# Patient Record
Sex: Female | Born: 1990 | Hispanic: Yes | Marital: Single | Smoking: Never smoker
Health system: Southern US, Community
[De-identification: ages and names within clinical notes are randomized; demographics above are authoritative.]

## PROBLEM LIST (undated history)

## (undated) DIAGNOSIS — Z789 Other specified health status: Secondary | ICD-10-CM

## (undated) HISTORY — PX: NO PAST SURGERIES: SHX2092

## (undated) HISTORY — DX: Other specified health status: Z78.9

---

## 2010-08-01 ENCOUNTER — Ambulatory Visit: Payer: Self-pay | Admitting: Obstetrics and Gynecology

## 2010-08-01 LAB — CONVERTED CEMR LAB
Antibody Screen: NEGATIVE
Basophils Absolute: 0 10*3/uL (ref 0.0–0.1)
Basophils Relative: 0 % (ref 0–1)
Chlamydia, DNA Probe: NEGATIVE
Eosinophils Absolute: 0 10*3/uL (ref 0.0–0.7)
Eosinophils Relative: 0 % (ref 0–5)
GC Probe Amp, Genital: NEGATIVE
HCT: 37.3 % (ref 36.0–46.0)
Hemoglobin: 12.7 g/dL (ref 12.0–15.0)
Hepatitis B Surface Ag: NEGATIVE
Hgb A2 Quant: 1.7 % — ABNORMAL LOW (ref 2.2–3.2)
Hgb A: 98.3 % — ABNORMAL HIGH (ref 96.8–97.8)
Hgb F Quant: 0 % (ref 0.0–2.0)
Hgb S Quant: 0 % (ref 0.0–0.0)
Lymphocytes Relative: 19 % (ref 12–46)
Lymphs Abs: 1.6 10*3/uL (ref 0.7–4.0)
MCHC: 34 g/dL (ref 30.0–36.0)
MCV: 92.6 fL (ref 78.0–100.0)
Monocytes Absolute: 0.3 10*3/uL (ref 0.1–1.0)
Monocytes Relative: 4 % (ref 3–12)
Neutro Abs: 6.5 10*3/uL (ref 1.7–7.7)
Neutrophils Relative %: 77 % (ref 43–77)
Platelets: 202 10*3/uL (ref 150–400)
RBC: 4.03 M/uL (ref 3.87–5.11)
RDW: 13.6 % (ref 11.5–15.5)
Rh Type: POSITIVE
Rubella: 160.7 intl units/mL — ABNORMAL HIGH
WBC: 8.5 10*3/uL (ref 4.0–10.5)

## 2010-08-02 ENCOUNTER — Encounter: Payer: Self-pay | Admitting: Obstetrics and Gynecology

## 2010-08-02 LAB — CONVERTED CEMR LAB
Clue Cells Wet Prep HPF POC: NONE SEEN
Trich, Wet Prep: NONE SEEN

## 2010-08-03 ENCOUNTER — Ambulatory Visit (HOSPITAL_COMMUNITY): Admission: RE | Admit: 2010-08-03 | Discharge: 2010-08-03 | Payer: Self-pay | Admitting: Family Medicine

## 2010-08-29 ENCOUNTER — Ambulatory Visit: Payer: Self-pay | Admitting: Obstetrics & Gynecology

## 2010-09-26 ENCOUNTER — Encounter: Payer: Self-pay | Admitting: Physician Assistant

## 2010-09-26 ENCOUNTER — Ambulatory Visit: Payer: Self-pay | Admitting: Obstetrics and Gynecology

## 2010-10-17 ENCOUNTER — Ambulatory Visit (HOSPITAL_COMMUNITY): Admission: RE | Admit: 2010-10-17 | Discharge: 2010-10-17 | Payer: Self-pay | Admitting: Family Medicine

## 2010-10-17 ENCOUNTER — Encounter: Payer: Self-pay | Admitting: Obstetrics and Gynecology

## 2010-10-17 ENCOUNTER — Ambulatory Visit: Payer: Self-pay | Admitting: Obstetrics and Gynecology

## 2010-10-31 ENCOUNTER — Ambulatory Visit: Payer: Self-pay | Admitting: Obstetrics and Gynecology

## 2010-10-31 ENCOUNTER — Encounter: Payer: Self-pay | Admitting: Obstetrics and Gynecology

## 2010-10-31 LAB — CONVERTED CEMR LAB
HCT: 37.4 % (ref 36.0–46.0)
HIV: NONREACTIVE
Hemoglobin: 12.2 g/dL (ref 12.0–15.0)
MCHC: 32.6 g/dL (ref 30.0–36.0)
MCV: 95.4 fL (ref 78.0–100.0)
Platelets: 241 10*3/uL (ref 150–400)
RBC: 3.92 M/uL (ref 3.87–5.11)
RDW: 13.2 % (ref 11.5–15.5)
WBC: 10.2 10*3/uL (ref 4.0–10.5)

## 2010-11-01 ENCOUNTER — Encounter: Payer: Self-pay | Admitting: Obstetrics and Gynecology

## 2010-11-14 ENCOUNTER — Ambulatory Visit: Payer: Self-pay | Admitting: Physician Assistant

## 2010-11-14 ENCOUNTER — Encounter: Payer: Self-pay | Admitting: Infectious Disease

## 2010-11-28 ENCOUNTER — Ambulatory Visit: Admit: 2010-11-28 | Payer: Self-pay | Admitting: Obstetrics and Gynecology

## 2010-12-05 ENCOUNTER — Ambulatory Visit: Admit: 2010-12-05 | Payer: Self-pay | Admitting: Obstetrics and Gynecology

## 2010-12-12 ENCOUNTER — Encounter: Payer: Self-pay | Admitting: Obstetrics and Gynecology

## 2010-12-12 ENCOUNTER — Ambulatory Visit
Admission: RE | Admit: 2010-12-12 | Discharge: 2010-12-12 | Payer: Self-pay | Source: Home / Self Care | Attending: Obstetrics & Gynecology | Admitting: Obstetrics & Gynecology

## 2010-12-12 LAB — CONVERTED CEMR LAB
Chlamydia, DNA Probe: NEGATIVE
GC Probe Amp, Genital: NEGATIVE

## 2010-12-17 LAB — POCT URINALYSIS DIPSTICK
Bilirubin Urine: NEGATIVE
Nitrite: NEGATIVE
Protein, ur: 30 mg/dL — AB
Specific Gravity, Urine: 1.025 (ref 1.005–1.030)
Urine Glucose, Fasting: NEGATIVE mg/dL
Urobilinogen, UA: 0.2 mg/dL (ref 0.0–1.0)
pH: 6 (ref 5.0–8.0)

## 2010-12-19 ENCOUNTER — Ambulatory Visit
Admission: RE | Admit: 2010-12-19 | Discharge: 2010-12-19 | Payer: Self-pay | Source: Home / Self Care | Attending: Obstetrics and Gynecology | Admitting: Obstetrics and Gynecology

## 2010-12-19 LAB — POCT URINALYSIS DIPSTICK
Bilirubin Urine: NEGATIVE
Ketones, ur: NEGATIVE mg/dL
Nitrite: NEGATIVE
Protein, ur: 100 mg/dL — AB
Specific Gravity, Urine: >=1.03 (ref 1.005–1.030)
Urine Glucose, Fasting: NEGATIVE mg/dL
Urobilinogen, UA: 0.2 mg/dL (ref 0.0–1.0)
pH: 6 (ref 5.0–8.0)

## 2010-12-20 ENCOUNTER — Inpatient Hospital Stay (HOSPITAL_COMMUNITY)
Admission: AD | Admit: 2010-12-20 | Discharge: 2010-12-22 | Payer: Self-pay | Source: Home / Self Care | Attending: Obstetrics & Gynecology | Admitting: Obstetrics & Gynecology

## 2010-12-20 ENCOUNTER — Inpatient Hospital Stay (HOSPITAL_COMMUNITY)
Admission: AD | Admit: 2010-12-20 | Discharge: 2010-12-20 | Payer: Self-pay | Source: Home / Self Care | Attending: Obstetrics & Gynecology | Admitting: Obstetrics & Gynecology

## 2010-12-20 ENCOUNTER — Encounter: Payer: Self-pay | Admitting: Obstetrics and Gynecology

## 2010-12-20 LAB — CBC
HCT: 35.1 % — ABNORMAL LOW (ref 36.0–46.0)
Hemoglobin: 11.6 g/dL — ABNORMAL LOW (ref 12.0–15.0)
MCH: 30.4 pg (ref 26.0–34.0)
MCHC: 33 g/dL (ref 30.0–36.0)
MCV: 92.1 fL (ref 78.0–100.0)
Platelets: 219 10*3/uL (ref 150–400)
RBC: 3.81 MIL/uL — ABNORMAL LOW (ref 3.87–5.11)
RDW: 14.1 % (ref 11.5–15.5)
WBC: 13.9 10*3/uL — ABNORMAL HIGH (ref 4.0–10.5)

## 2010-12-20 LAB — RPR: RPR Ser Ql: NONREACTIVE

## 2010-12-20 LAB — ABO/RH: ABO/RH(D): O POS

## 2010-12-21 LAB — CBC
HCT: 31.5 % — ABNORMAL LOW (ref 36.0–46.0)
Hemoglobin: 10.3 g/dL — ABNORMAL LOW (ref 12.0–15.0)
MCH: 30.4 pg (ref 26.0–34.0)
MCHC: 32.7 g/dL (ref 30.0–36.0)
MCV: 92.9 fL (ref 78.0–100.0)
Platelets: 188 10*3/uL (ref 150–400)
RBC: 3.39 MIL/uL — ABNORMAL LOW (ref 3.87–5.11)
RDW: 14.3 % (ref 11.5–15.5)
WBC: 14.7 10*3/uL — ABNORMAL HIGH (ref 4.0–10.5)

## 2011-01-23 ENCOUNTER — Ambulatory Visit (INDEPENDENT_AMBULATORY_CARE_PROVIDER_SITE_OTHER): Payer: Medicaid Other | Admitting: Family Medicine

## 2011-02-04 LAB — POCT URINALYSIS DIPSTICK
Bilirubin Urine: NEGATIVE
Glucose, UA: NEGATIVE mg/dL
Ketones, ur: NEGATIVE mg/dL
Nitrite: NEGATIVE
Protein, ur: NEGATIVE mg/dL
Specific Gravity, Urine: 1.025 (ref 1.005–1.030)
Urobilinogen, UA: 0.2 mg/dL (ref 0.0–1.0)
pH: 5.5 (ref 5.0–8.0)

## 2011-02-05 LAB — POCT URINALYSIS DIPSTICK
Bilirubin Urine: NEGATIVE
Bilirubin Urine: NEGATIVE
Bilirubin Urine: NEGATIVE
Glucose, UA: NEGATIVE mg/dL
Glucose, UA: NEGATIVE mg/dL
Glucose, UA: NEGATIVE mg/dL
Hgb urine dipstick: NEGATIVE
Ketones, ur: NEGATIVE mg/dL
Ketones, ur: NEGATIVE mg/dL
Ketones, ur: NEGATIVE mg/dL
Nitrite: NEGATIVE
Nitrite: NEGATIVE
Nitrite: NEGATIVE
Protein, ur: NEGATIVE mg/dL
Protein, ur: NEGATIVE mg/dL
Protein, ur: NEGATIVE mg/dL
Specific Gravity, Urine: 1.02 (ref 1.005–1.030)
Specific Gravity, Urine: 1.015 (ref 1.005–1.030)
Specific Gravity, Urine: 1.02 (ref 1.005–1.030)
Urobilinogen, UA: 0.2 mg/dL (ref 0.0–1.0)
Urobilinogen, UA: 0.2 mg/dL (ref 0.0–1.0)
Urobilinogen, UA: 0.2 mg/dL (ref 0.0–1.0)
pH: 5.5 (ref 5.0–8.0)
pH: 5.5 (ref 5.0–8.0)
pH: 6.5 (ref 5.0–8.0)

## 2011-02-07 LAB — POCT URINALYSIS DIPSTICK
Bilirubin Urine: NEGATIVE
Bilirubin Urine: NEGATIVE
Glucose, UA: NEGATIVE mg/dL
Glucose, UA: NEGATIVE mg/dL
Hgb urine dipstick: NEGATIVE
Hgb urine dipstick: NEGATIVE
Ketones, ur: NEGATIVE mg/dL
Ketones, ur: NEGATIVE mg/dL
Nitrite: NEGATIVE
Nitrite: NEGATIVE
Protein, ur: NEGATIVE mg/dL
Protein, ur: NEGATIVE mg/dL
Specific Gravity, Urine: 1.02 (ref 1.005–1.030)
Specific Gravity, Urine: 1.025 (ref 1.005–1.030)
Urobilinogen, UA: 0.2 mg/dL (ref 0.0–1.0)
Urobilinogen, UA: 0.2 mg/dL (ref 0.0–1.0)
pH: 6.5 (ref 5.0–8.0)
pH: 6 (ref 5.0–8.0)

## 2011-02-15 NOTE — Progress Notes (Signed)
Tara Suarez, Tara Suarez        ACCOUNT NO.:  [id number]  MEDICAL RECORD NO.:  [id number]           PATIENT TYPE:  A  LOCATION:  [facility]                   FACILITY:  [facility]  PHYSICIAN:  Lucina Mellow, DO   DATE OF BIRTH:  [birth year]  DATE OF SERVICE:  01/23/2011                                 CLINIC NOTE  She presents today for her postpartum check.  HISTORY OF PRESENT ILLNESS:  The patient is a 20 year old gravida 1, para 1 who delivered by vaginal delivery on December 20, 2010, baby weighed 5 pounds 15 ounces.  She after delivery was noted to have a second degree laceration which was repaired.  She initially was breast and bottle feeding but states today she is only bottle-feeding.  She has not resumed intercourse.  She is currently taking no birth control.  She states that she does not desire any birth control, but she does not want to be pregnant again and states she will use condoms.  She states that she has no breast pain.  Her bleeding has stopped and has not had a period.  She has never had a Pap smear because she is less than 74 years old.  She has no past medical history.  No past surgical history.  PHYSICAL EXAMINATION:  VITAL SIGNS:  On exam today, her blood pressure is 118/64, pulse of 70, temperature of 98.0, weight of 163.9, height is 64 inches. GENERAL:  She is a pleasant Hispanic female who looks her stated age and 20 years old. HEART:  Regular rate and rhythm. LUNGS:  Clear to auscultation bilaterally. NECK:  Thyroid is palpable but not enlarged. ABDOMEN:  Soft and benign. GU:  Her external genitalia are normal in appearance and slightly internal.  Her introitus is noted to be well healed with no abnormal scarring or scar tissue palpated on exam.  A Pap test is not done at this time.  ASSESSMENT:  Postpartum exam doing well.  The patient declines birth control, and she is advised of her options to go to the health department if she changes her mind  and would like to start any kind of hormone birth control at any point.  We also discussed HPV vaccine with Gardasil and again she inquired about getting that done through the health department.  Her postpartum depression screen, her EPDS score of 2/30 which is a negative screening.  The patient can return p.r.n. for additional pregnancies or she can return when she turns 20 years old to get her first routine Pap testing.  The patient voices all understanding of these plans.          ______________________________ Lucina Mellow, DO    SH/MEDQ  D:  01/23/2011  T:  01/24/2011  Job:  [id number]

## 2014-06-02 ENCOUNTER — Ambulatory Visit (INDEPENDENT_AMBULATORY_CARE_PROVIDER_SITE_OTHER): Payer: BC Managed Care – PPO | Admitting: Family Medicine

## 2014-06-02 VITALS — BP 126/78 | HR 70 | Temp 98.8°F | Resp 17 | Ht 64.0 in | Wt 171.0 lb

## 2014-06-02 DIAGNOSIS — R599 Enlarged lymph nodes, unspecified: Secondary | ICD-10-CM

## 2014-06-02 DIAGNOSIS — R590 Localized enlarged lymph nodes: Secondary | ICD-10-CM

## 2014-06-02 DIAGNOSIS — R5381 Other malaise: Secondary | ICD-10-CM

## 2014-06-02 DIAGNOSIS — R5383 Other fatigue: Secondary | ICD-10-CM

## 2014-06-02 LAB — POCT URINE PREGNANCY: Preg Test, Ur: NEGATIVE

## 2014-06-02 LAB — POCT RAPID STREP A (OFFICE): Rapid Strep A Screen: NEGATIVE

## 2014-06-02 NOTE — Patient Instructions (Signed)
I will be in touch with the rest of your labs asap.  I do not see any evidence of dangerous illness, but I will do a few more labs to see if we can determine what is wrong.  I will call with your labs soon, and in the meantime try to rest and use OTC medications as needed for aches (tylenol, ibuprofen)

## 2014-06-02 NOTE — Progress Notes (Addendum)
[facility] [address] [place] [address] [phone]  Date:  06/02/2014   Name:  Tara Suarez   DOB:  [birth year]   MRN:  [id number]  PCP:  No PCP Per Patient    Chief Complaint: Dizziness and Headache   History of Present Illness:  Tara Suarez is a 23 y.o. very pleasant female patient who presents with the following:  She is here today with complaint of feeling "kind of dizzy.'  She may feel lightheaded, and the side of her neck hurts.   This am she had a headache- however this actually seems to have been just do to the pain in her neck.   No ST, no cough, no fever, no nausea or vomiting.   No nasal symptoms, no sneezing.  No GI symptoms  She is generally healthy.   She thinks her sx might be due to stress, or to her job.  She works in [employer], [employer].  LMP was 6/17.   There are no active problems to display for this patient.   History reviewed. No pertinent past medical history.  History reviewed. No pertinent past surgical history.  History  Substance Use Topics  . Smoking status: Never Smoker   . Smokeless tobacco: Not on file  . Alcohol Use: No    Family History  Problem Relation Age of Onset  . Diabetes Mother     No Known Allergies  Medication list has been reviewed and updated.  No current outpatient prescriptions on file prior to visit.   No current facility-administered medications on file prior to visit.    Review of Systems:  As per HPI- otherwise negative.   Physical Examination: Filed Vitals:   06/02/14 1505  BP: 126/78  Pulse: 101  Temp: 98.8 F (37.1 C)  Resp: 17   Filed Vitals:   06/02/14 1505  Height: 5\' 4"  (1.626 m)  Weight: 171 lb (77.565 kg)   Body mass index is 29.34 kg/(m^2). Ideal Body Weight: Weight in (lb) to have BMI = 25: 145.3  GEN: WDWN, NAD, Non-toxic, A & O x 3, looks well HEENT: Atraumatic, Normocephalic. Neck supple. No masses.  Bilateral TM wnl, oropharynx normal.   PEERL,EOMI.  She has small, tender posterior cervical nodes on the right.  Ears and Nose: No external deformity. CV: RRR, No M/G/R. No JVD. No thrill. No extra heart sounds. PULM: CTA B, no wheezes, crackles, rhonchi. No retractions. No resp. distress. No accessory muscle use. ABD: S, NT, ND. No rebound. No HSM. EXTR: No c/c/e, no rash to suggest RMSF NEURO Normal gait. Normal movement of all extremities PSYCH: Normally interactive. Conversant. Not depressed or anxious appearing.  Calm demeanor.   Results for orders placed in visit on 06/02/14  POCT URINE PREGNANCY      Result Value Ref Range   Preg Test, Ur Negative    POCT RAPID STREP A (OFFICE)      Result Value Ref Range   Rapid Strep A Screen Negative  Negative    Assessment and Plan: LAD (lymphadenopathy) of right cervical region - Plan: CBC, Comprehensive metabolic panel, Epstein-Barr virus VCA antibody panel, POCT rapid strep A  Other malaise and fatigue - Plan: TSH, POCT urine pregnancy, POCT rapid strep A  Well appearing woman here with fatigue, vague feeling of dizziness and malaise.  Possible mono vs another viral infection.  Await other labs as above See patient instructions for more details.     Signed Abbe Amsterdam, MD  Called 7/10: received her labs Results for orders placed in visit on 06/02/14  CBC      Result Value Ref Range   WBC 10.3  4.0 - 10.5 K/uL   RBC 4.47  3.87 - 5.11 MIL/uL   Hemoglobin 13.5  12.0 - 15.0 g/dL   HCT 40.938.4  81.136.0 - 91.446.0 %   MCV 85.9  78.0 - 100.0 fL   MCH 30.2  26.0 - 34.0 pg   MCHC 35.2  30.0 - 36.0 g/dL   RDW 78.213.5  95.611.5 - 21.315.5 %   Platelets 208  150 - 400 K/uL  COMPREHENSIVE METABOLIC PANEL      Result Value Ref Range   Sodium 137  135 - 145 mEq/L   Potassium 4.1  3.5 - 5.3 mEq/L   Chloride 102  96 - 112 mEq/L   CO2 27  19 - 32 mEq/L   Glucose, Bld 102 (*) 70 - 99 mg/dL   BUN 11  6 - 23 mg/dL   Creat 0.860.89  5.780.50 - 4.691.10 mg/dL   Total Bilirubin 0.4  0.2 - 1.2 mg/dL    Alkaline Phosphatase 75  39 - 117 U/L   AST 26  0 - 37 U/L   ALT 37 (*) 0 - 35 U/L   Total Protein 7.4  6.0 - 8.3 g/dL   Albumin 4.4  3.5 - 5.2 g/dL   Calcium 9.3  8.4 - 62.910.5 mg/dL  TSH      Result Value Ref Range   TSH 2.245  0.350 - 4.500 uIU/mL  EPSTEIN-BARR VIRUS VCA ANTIBODY PANEL      Result Value Ref Range   EBV VCA IgG 450.0 (*) <18.0 U/mL   EBV VCA IgM <10.0  <36.0 U/mL   EBV EA IgG <5.0  <9.0 U/mL   EBV NA IgG >600.0 (*) <18.0 U/mL  POCT URINE PREGNANCY      Result Value Ref Range   Preg Test, Ur Negative    POCT RAPID STREP A (OFFICE)      Result Value Ref Range   Rapid Strep A Screen Negative  Negative   All look good, she has already had mono. She is taking some aspirin which helps, she is feeling better.  Asked her to let me know if not continuing to do well, she agreed

## 2014-06-03 LAB — COMPREHENSIVE METABOLIC PANEL
ALT: 37 U/L — ABNORMAL HIGH (ref 0–35)
AST: 26 U/L (ref 0–37)
Albumin: 4.4 g/dL (ref 3.5–5.2)
Alkaline Phosphatase: 75 U/L (ref 39–117)
BUN: 11 mg/dL (ref 6–23)
CO2: 27 mEq/L (ref 19–32)
Calcium: 9.3 mg/dL (ref 8.4–10.5)
Chloride: 102 mEq/L (ref 96–112)
Creat: 0.89 mg/dL (ref 0.50–1.10)
Glucose, Bld: 102 mg/dL — ABNORMAL HIGH (ref 70–99)
Potassium: 4.1 mEq/L (ref 3.5–5.3)
Sodium: 137 mEq/L (ref 135–145)
Total Bilirubin: 0.4 mg/dL (ref 0.2–1.2)
Total Protein: 7.4 g/dL (ref 6.0–8.3)

## 2014-06-03 LAB — TSH: TSH: 2.245 u[IU]/mL (ref 0.350–4.500)

## 2014-06-03 LAB — CBC
HCT: 38.4 % (ref 36.0–46.0)
Hemoglobin: 13.5 g/dL (ref 12.0–15.0)
MCH: 30.2 pg (ref 26.0–34.0)
MCHC: 35.2 g/dL (ref 30.0–36.0)
MCV: 85.9 fL (ref 78.0–100.0)
Platelets: 208 10*3/uL (ref 150–400)
RBC: 4.47 MIL/uL (ref 3.87–5.11)
RDW: 13.5 % (ref 11.5–15.5)
WBC: 10.3 10*3/uL (ref 4.0–10.5)

## 2014-06-03 LAB — EPSTEIN-BARR VIRUS VCA ANTIBODY PANEL
EBV EA IgG: <5 U/mL (ref <9.0)
EBV NA IgG: >600 U/mL — ABNORMAL HIGH (ref <18.0)
EBV VCA IgG: 450 U/mL — ABNORMAL HIGH (ref <18.0)
EBV VCA IgM: <10 U/mL (ref <36.0)

## 2015-11-07 ENCOUNTER — Encounter: Payer: Self-pay | Admitting: Family Medicine

## 2015-11-08 ENCOUNTER — Encounter: Payer: Self-pay | Admitting: *Deleted

## 2015-11-21 ENCOUNTER — Ambulatory Visit (INDEPENDENT_AMBULATORY_CARE_PROVIDER_SITE_OTHER): Payer: Medicaid Other | Admitting: Certified Nurse Midwife

## 2015-11-21 ENCOUNTER — Other Ambulatory Visit (HOSPITAL_COMMUNITY)
Admission: RE | Admit: 2015-11-21 | Discharge: 2015-11-21 | Disposition: A | Discharge status: Home / Self Care | Payer: Medicaid Other | Source: Ambulatory Visit | Attending: Certified Nurse Midwife | Admitting: Certified Nurse Midwife

## 2015-11-21 ENCOUNTER — Encounter: Payer: Self-pay | Admitting: Certified Nurse Midwife

## 2015-11-21 VITALS — BP 120/61 | HR 77 | Temp 97.9°F | Ht 62.0 in | Wt 153.0 lb

## 2015-11-21 DIAGNOSIS — Z124 Encounter for screening for malignant neoplasm of cervix: Secondary | ICD-10-CM | POA: Diagnosis not present

## 2015-11-21 DIAGNOSIS — Z3481 Encounter for supervision of other normal pregnancy, first trimester: Secondary | ICD-10-CM | POA: Diagnosis not present

## 2015-11-21 DIAGNOSIS — Z113 Encounter for screening for infections with a predominantly sexual mode of transmission: Secondary | ICD-10-CM

## 2015-11-21 DIAGNOSIS — Z3491 Encounter for supervision of normal pregnancy, unspecified, first trimester: Secondary | ICD-10-CM | POA: Diagnosis not present

## 2015-11-21 DIAGNOSIS — Z01419 Encounter for gynecological examination (general) (routine) without abnormal findings: Secondary | ICD-10-CM | POA: Insufficient documentation

## 2015-11-21 LAB — POCT URINALYSIS DIP (DEVICE)
Bilirubin Urine: NEGATIVE
Glucose, UA: NEGATIVE mg/dL
Hgb urine dipstick: NEGATIVE
Ketones, ur: NEGATIVE mg/dL
Nitrite: NEGATIVE
Protein, ur: NEGATIVE mg/dL
Specific Gravity, Urine: 1.015 (ref 1.005–1.030)
Urobilinogen, UA: 0.2 mg/dL (ref 0.0–1.0)
pH: 8 (ref 5.0–8.0)

## 2015-11-21 NOTE — Progress Notes (Signed)
New OB packet given

## 2015-11-21 NOTE — Progress Notes (Signed)
   Subjective:    Tara Suarez is a G2P1001 1557w6d being seen today for her first obstetrical visit.  Her obstetrical history is significant for none. Patient does intend to breast feed. Pregnancy history fully reviewed.  Patient reports no complaints.  Filed Vitals:   11/21/15 0847 11/21/15 0847  BP: 120/61   Pulse: 77   Temp: 97.9 F (36.6 C)   Height:  5\' 2"  (1.575 m)  Weight: 153 lb (69.4 kg)     HISTORY: OB History  Gravida Para Term Preterm AB SAB TAB Ectopic Multiple Living  2 1 1  0 0 0 0 0 0 1    # Outcome Date GA Lbr Len/2nd Weight Sex Delivery Anes PTL Lv  2 Current           1 Term 12/20/10 8260w6d  5 lb 14.5 oz (2.68 kg) F Vag-Spont None N Y     Past Medical History  Diagnosis Date  . Medical history non-contributory    Past Surgical History  Procedure Laterality Date  . No past surgeries     Family History  Problem Relation Age of Onset  . Diabetes Mother   . Heart disease Mother      Exam    Uterus:     Pelvic Exam:    Perineum: No Hemorrhoids   Vulva: normal   Vagina:  normal mucosa   pH:    Cervix: no bleeding following Pap   Adnexa: not evaluated   Bony Pelvis: gynecoid  System: Breast:     Skin: normal coloration and turgor, no rashes    Neurologic: oriented, normal   Extremities: normal strength, tone, and muscle mass   HEENT    Mouth/Teeth mucous membranes moist, pharynx normal without lesions   Neck supple and no masses   Cardiovascular: regular rate and rhythm   Respiratory:  appears well, vitals normal, no respiratory distress, acyanotic, normal RR, ear and throat exam is normal, neck free of mass or lymphadenopathy, chest clear, no wheezing, crepitations, rhonchi, normal symmetric air entry   Abdomen: soft, non-tender; bowel sounds normal; no masses,  no organomegaly   Urinary: urethral meatus normal      Assessment:    Pregnancy: G2P1001 Patient Active Problem List   Diagnosis Date Noted  . Encounter for  supervision of other normal pregnancy in first trimester 11/21/2015        Plan:     Initial labs drawn. Early GTT family hx of diabetes Prenatal vitamins. Problem list reviewed and updated. Genetic Screening discussed First Screen and Quad Screen: declined.  Ultrasound discussed; fetal survey: requested.  Follow up in 4 weeks. 50% of 30 min visit spent on counseling and coordination of care.    Clemmons,Lori Grissett 11/21/2015

## 2015-11-21 NOTE — Patient Instructions (Signed)
Safe Medications in Pregnancy   Acne: Benzoyl Peroxide Salicylic Acid  Backache/Headache: Tylenol: 2 regular strength every 4 hours OR              2 Extra strength every 6 hours  Colds/Coughs/Allergies: Benadryl (alcohol free) 25 mg every 6 hours as needed Breath right strips Claritin Cepacol throat lozenges Chloraseptic throat spray Cold-Eeze- up to three times per day Cough drops, alcohol free Flonase (by prescription only) Guaifenesin Mucinex Robitussin DM (plain only, alcohol free) Saline nasal spray/drops Sudafed (pseudoephedrine) & Actifed ** use only after [redacted] weeks gestation and if you do not have high blood pressure Tylenol Vicks Vaporub Zinc lozenges Zyrtec   Constipation: Colace Ducolax suppositories Fleet enema Glycerin suppositories Metamucil Milk of magnesia Miralax Senokot Smooth move tea  Diarrhea: Kaopectate Imodium A-D  *NO pepto Bismol  Hemorrhoids: Anusol Anusol HC Preparation H Tucks  Indigestion: Tums Maalox Mylanta Zantac  Pepcid  Insomnia: Benadryl (alcohol free) 25mg every 6 hours as needed Tylenol PM Unisom, no Gelcaps  Leg Cramps: Tums MagGel  Nausea/Vomiting:  Bonine Dramamine Emetrol Ginger extract Sea bands Meclizine  Nausea medication to take during pregnancy:  Unisom (doxylamine succinate 25 mg tablets) Take one tablet daily at bedtime. If symptoms are not adequately controlled, the dose can be increased to a maximum recommended dose of two tablets daily (1/2 tablet in the morning, 1/2 tablet mid-afternoon and one at bedtime). Vitamin B6 100mg tablets. Take one tablet twice a day (up to 200 mg per day).  Skin Rashes: Aveeno products Benadryl cream or 25mg every 6 hours as needed Calamine Lotion 1% cortisone cream  Yeast infection: Gyne-lotrimin 7 Monistat 7   **If taking multiple medications, please check labels to avoid duplicating the same active ingredients **take medication as directed on  the label ** Do not exceed 4000 mg of tylenol in 24 hours **Do not take medications that contain aspirin or ibuprofen    First Trimester of Pregnancy The first trimester of pregnancy is from week 1 until the end of week 12 (months 1 through 3). A week after a sperm fertilizes an egg, the egg will implant on the wall of the uterus. This embryo will begin to develop into a baby. Genes from you and your partner are forming the baby. The female genes determine whether the baby is a boy or a girl. At 6-8 weeks, the eyes and face are formed, and the heartbeat can be seen on ultrasound. At the end of 12 weeks, all the baby's organs are formed.  Now that you are pregnant, you will want to do everything you can to have a healthy baby. Two of the most important things are to get good prenatal care and to follow your health care provider's instructions. Prenatal care is all the medical care you receive before the baby's birth. This care will help prevent, find, and treat any problems during the pregnancy and childbirth. BODY CHANGES Your body goes through many changes during pregnancy. The changes vary from woman to woman.   You may gain or lose a couple of pounds at first.  You may feel sick to your stomach (nauseous) and throw up (vomit). If the vomiting is uncontrollable, call your health care provider.  You may tire easily.  You may develop headaches that can be relieved by medicines approved by your health care provider.  You may urinate more often. Painful urination may mean you have a bladder infection.  You may develop heartburn as a result of your   pregnancy.  You may develop constipation because certain hormones are causing the muscles that push waste through your intestines to slow down.  You may develop hemorrhoids or swollen, bulging veins (varicose veins).  Your breasts may begin to grow larger and become tender. Your nipples may stick out more, and the tissue that surrounds them (areola)  may become darker.  Your gums may bleed and may be sensitive to brushing and flossing.  Dark spots or blotches (chloasma, mask of pregnancy) may develop on your face. This will likely fade after the baby is born.  Your menstrual periods will stop.  You may have a loss of appetite.  You may develop cravings for certain kinds of food.  You may have changes in your emotions from day to day, such as being excited to be pregnant or being concerned that something may go wrong with the pregnancy and baby.  You may have more vivid and strange dreams.  You may have changes in your hair. These can include thickening of your hair, rapid growth, and changes in texture. Some women also have hair loss during or after pregnancy, or hair that feels dry or thin. Your hair will most likely return to normal after your baby is born. WHAT TO EXPECT AT YOUR PRENATAL VISITS During a routine prenatal visit:  You will be weighed to make sure you and the baby are growing normally.  Your blood pressure will be taken.  Your abdomen will be measured to track your baby's growth.  The fetal heartbeat will be listened to starting around week 10 or 12 of your pregnancy.  Test results from any previous visits will be discussed. Your health care provider may ask you:  How you are feeling.  If you are feeling the baby move.  If you have had any abnormal symptoms, such as leaking fluid, bleeding, severe headaches, or abdominal cramping.  If you are using any tobacco products, including cigarettes, chewing tobacco, and electronic cigarettes.  If you have any questions. Other tests that may be performed during your first trimester include:  Blood tests to find your blood type and to check for the presence of any previous infections. They will also be used to check for low iron levels (anemia) and Rh antibodies. Later in the pregnancy, blood tests for diabetes will be done along with other tests if problems  develop.  Urine tests to check for infections, diabetes, or protein in the urine.  An ultrasound to confirm the proper growth and development of the baby.  An amniocentesis to check for possible genetic problems.  Fetal screens for spina bifida and Down syndrome.  You may need other tests to make sure you and the baby are doing well.  HIV (human immunodeficiency virus) testing. Routine prenatal testing includes screening for HIV, unless you choose not to have this test. HOME CARE INSTRUCTIONS  Medicines  Follow your health care provider's instructions regarding medicine use. Specific medicines may be either safe or unsafe to take during pregnancy.  Take your prenatal vitamins as directed.  If you develop constipation, try taking a stool softener if your health care provider approves. Diet  Eat regular, well-balanced meals. Choose a variety of foods, such as meat or vegetable-based protein, fish, milk and low-fat dairy products, vegetables, fruits, and whole grain breads and cereals. Your health care provider will help you determine the amount of weight gain that is right for you.  Avoid raw meat and uncooked cheese. These carry germs that can cause   birth defects in the baby.  Eating four or five small meals rather than three large meals a day may help relieve nausea and vomiting. If you start to feel nauseous, eating a few soda crackers can be helpful. Drinking liquids between meals instead of during meals also seems to help nausea and vomiting.  If you develop constipation, eat more high-fiber foods, such as fresh vegetables or fruit and whole grains. Drink enough fluids to keep your urine clear or pale yellow. Activity and Exercise  Exercise only as directed by your health care provider. Exercising will help you:  Control your weight.  Stay in shape.  Be prepared for labor and delivery.  Experiencing pain or cramping in the lower abdomen or low back is a good sign that you  should stop exercising. Check with your health care provider before continuing normal exercises.  Try to avoid standing for long periods of time. Move your legs often if you must stand in one place for a long time.  Avoid heavy lifting.  Wear low-heeled shoes, and practice good posture.  You may continue to have sex unless your health care provider directs you otherwise. Relief of Pain or Discomfort  Wear a good support bra for breast tenderness.   Take warm sitz baths to soothe any pain or discomfort caused by hemorrhoids. Use hemorrhoid cream if your health care provider approves.   Rest with your legs elevated if you have leg cramps or low back pain.  If you develop varicose veins in your legs, wear support hose. Elevate your feet for 15 minutes, 3-4 times a day. Limit salt in your diet. Prenatal Care  Schedule your prenatal visits by the twelfth week of pregnancy. They are usually scheduled monthly at first, then more often in the last 2 months before delivery.  Write down your questions. Take them to your prenatal visits.  Keep all your prenatal visits as directed by your health care provider. Safety  Wear your seat belt at all times when driving.  Make a list of emergency phone numbers, including numbers for family, friends, the hospital, and police and fire departments. General Tips  Ask your health care provider for a referral to a local prenatal education class. Begin classes no later than at the beginning of month 6 of your pregnancy.  Ask for help if you have counseling or nutritional needs during pregnancy. Your health care provider can offer advice or refer you to specialists for help with various needs.  Do not use hot tubs, steam rooms, or saunas.  Do not douche or use tampons or scented sanitary pads.  Do not cross your legs for long periods of time.  Avoid cat litter boxes and soil used by cats. These carry germs that can cause birth defects in the baby  and possibly loss of the fetus by miscarriage or stillbirth.  Avoid all smoking, herbs, alcohol, and medicines not prescribed by your health care provider. Chemicals in these affect the formation and growth of the baby.  Do not use any tobacco products, including cigarettes, chewing tobacco, and electronic cigarettes. If you need help quitting, ask your health care provider. You may receive counseling support and other resources to help you quit.  Schedule a dentist appointment. At home, brush your teeth with a soft toothbrush and be gentle when you floss. SEEK MEDICAL CARE IF:   You have dizziness.  You have mild pelvic cramps, pelvic pressure, or nagging pain in the abdominal area.  You have persistent   nausea, vomiting, or diarrhea.  You have a bad smelling vaginal discharge.  You have pain with urination.  You notice increased swelling in your face, hands, legs, or ankles. SEEK IMMEDIATE MEDICAL CARE IF:   You have a fever.  You are leaking fluid from your vagina.  You have spotting or bleeding from your vagina.  You have severe abdominal cramping or pain.  You have rapid weight gain or loss.  You vomit blood or material that looks like coffee grounds.  You are exposed to German measles and have never had them.  You are exposed to fifth disease or chickenpox.  You develop a severe headache.  You have shortness of breath.  You have any kind of trauma, such as from a fall or a car accident.   This information is not intended to replace advice given to you by your health care provider. Make sure you discuss any questions you have with your health care provider.   Document Released: 11/05/2001 Document Revised: 12/02/2014 Document Reviewed: 09/21/2013 Elsevier Interactive Patient Education 2016 [employer]  

## 2015-11-22 LAB — PRESCRIPTION MONITORING PROFILE (19 PANEL)
Amphetamine/Meth: NEGATIVE ng/mL
Barbiturate Screen, Urine: NEGATIVE ng/mL
Benzodiazepine Screen, Urine: NEGATIVE ng/mL
Buprenorphine, Urine: NEGATIVE ng/mL
Cannabinoid Scrn, Ur: NEGATIVE ng/mL
Carisoprodol, Urine: NEGATIVE ng/mL
Cocaine Metabolites: NEGATIVE ng/mL
Creatinine, Urine: 87.38 mg/dL (ref >20.0)
Fentanyl, Ur: NEGATIVE ng/mL
MDMA URINE: NEGATIVE ng/mL
Meperidine, Ur: NEGATIVE ng/mL
Methadone Screen, Urine: NEGATIVE ng/mL
Methaqualone: NEGATIVE ng/mL
Nitrites, Initial: NEGATIVE ug/mL
Opiate Screen, Urine: NEGATIVE ng/mL
Oxycodone Screen, Ur: NEGATIVE ng/mL
Phencyclidine, Ur: NEGATIVE ng/mL
Propoxyphene: NEGATIVE ng/mL
Tapentadol, urine: NEGATIVE ng/mL
Tramadol Scrn, Ur: NEGATIVE ng/mL
Zolpidem, Urine: NEGATIVE ng/mL
pH, Initial: 7.5 pH (ref 4.5–8.9)

## 2015-11-22 LAB — PRENATAL PROFILE (SOLSTAS)
Antibody Screen: NEGATIVE
Basophils Absolute: 0 10*3/uL (ref 0.0–0.1)
Basophils Relative: 0 % (ref 0–1)
Eosinophils Absolute: 0 10*3/uL (ref 0.0–0.7)
Eosinophils Relative: 0 % (ref 0–5)
HCT: 39.6 % (ref 36.0–46.0)
HIV 1&2 Ab, 4th Generation: NONREACTIVE
Hemoglobin: 13.4 g/dL (ref 12.0–15.0)
Hepatitis B Surface Ag: NEGATIVE
Lymphocytes Relative: 23 % (ref 12–46)
Lymphs Abs: 1.7 10*3/uL (ref 0.7–4.0)
MCH: 31 pg (ref 26.0–34.0)
MCHC: 33.8 g/dL (ref 30.0–36.0)
MCV: 91.7 fL (ref 78.0–100.0)
MPV: 10.1 fL (ref 8.6–12.4)
Monocytes Absolute: 0.4 10*3/uL (ref 0.1–1.0)
Monocytes Relative: 5 % (ref 3–12)
Neutro Abs: 5.5 10*3/uL (ref 1.7–7.7)
Neutrophils Relative %: 72 % (ref 43–77)
Platelets: 171 10*3/uL (ref 150–400)
RBC: 4.32 MIL/uL (ref 3.87–5.11)
RDW: 13.3 % (ref 11.5–15.5)
Rh Type: POSITIVE
Rubella: 7.54 Index — ABNORMAL HIGH (ref <0.90)
WBC: 7.6 10*3/uL (ref 4.0–10.5)

## 2015-11-22 LAB — CULTURE, OB URINE
Colony Count: NO GROWTH
Organism ID, Bacteria: NO GROWTH

## 2015-11-22 LAB — GLUCOSE TOLERANCE, 1 HOUR (50G) W/O FASTING: Glucose, 1 Hour GTT: 100 mg/dL (ref 70–140)

## 2015-11-22 LAB — CYTOLOGY - PAP

## 2015-11-23 LAB — HEMOGLOBINOPATHY EVALUATION
Hemoglobin Other: 0 %
Hgb A2 Quant: 1.6 % — ABNORMAL LOW (ref 2.2–3.2)
Hgb A: 98.4 % — ABNORMAL HIGH (ref 96.8–97.8)
Hgb F Quant: 0 % (ref 0.0–2.0)
Hgb S Quant: 0 %

## 2015-11-25 LAB — GC/CHLAMYDIA PROBE AMP (~~LOC~~) NOT AT ARMC
Chlamydia: NEGATIVE
Neisseria Gonorrhea: NEGATIVE

## 2015-11-28 ENCOUNTER — Ambulatory Visit (HOSPITAL_COMMUNITY)
Admission: RE | Admit: 2015-11-28 | Discharge: 2015-11-28 | Disposition: A | Discharge status: Home / Self Care | Payer: Medicaid Other | Source: Ambulatory Visit | Attending: Certified Nurse Midwife | Admitting: Certified Nurse Midwife

## 2015-11-28 ENCOUNTER — Ambulatory Visit (HOSPITAL_COMMUNITY): Payer: Medicaid Other

## 2015-11-28 DIAGNOSIS — Z3A12 12 weeks gestation of pregnancy: Secondary | ICD-10-CM | POA: Diagnosis not present

## 2015-11-28 DIAGNOSIS — Z3491 Encounter for supervision of normal pregnancy, unspecified, first trimester: Secondary | ICD-10-CM

## 2015-11-28 DIAGNOSIS — Z36 Encounter for antenatal screening of mother: Secondary | ICD-10-CM | POA: Diagnosis present

## 2015-11-28 DIAGNOSIS — Z3481 Encounter for supervision of other normal pregnancy, first trimester: Secondary | ICD-10-CM

## 2015-12-20 ENCOUNTER — Encounter: Payer: Self-pay | Admitting: Obstetrics and Gynecology

## 2015-12-20 ENCOUNTER — Ambulatory Visit (INDEPENDENT_AMBULATORY_CARE_PROVIDER_SITE_OTHER): Payer: BLUE CROSS/BLUE SHIELD | Admitting: Obstetrics and Gynecology

## 2015-12-20 ENCOUNTER — Encounter: Payer: Self-pay | Admitting: Advanced Practice Midwife

## 2015-12-20 VITALS — BP 112/63 | HR 73 | Temp 97.6°F | Wt 153.1 lb

## 2015-12-20 DIAGNOSIS — Z3481 Encounter for supervision of other normal pregnancy, first trimester: Secondary | ICD-10-CM

## 2015-12-20 DIAGNOSIS — Z3482 Encounter for supervision of other normal pregnancy, second trimester: Secondary | ICD-10-CM | POA: Diagnosis not present

## 2015-12-20 LAB — POCT URINALYSIS DIP (DEVICE)
Bilirubin Urine: NEGATIVE
Glucose, UA: NEGATIVE mg/dL
Hgb urine dipstick: NEGATIVE
Ketones, ur: NEGATIVE mg/dL
Leukocytes, UA: NEGATIVE
Nitrite: NEGATIVE
Protein, ur: NEGATIVE mg/dL
Specific Gravity, Urine: >=1.03 (ref 1.005–1.030)
Urobilinogen, UA: 0.2 mg/dL (ref 0.0–1.0)
pH: 5.5 (ref 5.0–8.0)

## 2015-12-20 NOTE — Progress Notes (Signed)
Subjective:  Tara Suarez is a 25 y.o. G2P1001 at [redacted]w[redacted]d being seen today for ongoing prenatal care.  She is currently monitored for the following issues for this low-risk pregnancy and has Encounter for supervision of other normal pregnancy in first trimester on her problem list.  Patient reports occasional nausea and no vomiting. Dizzy intermittently.  Contractions: Not present. Vag. Bleeding: None.  Movement: Present. Denies leaking of fluid. Works at [employer]. Has 25 y/o.   The following portions of the patient's history were reviewed and updated as appropriate: allergies, current medications, past family history, past medical history, past social history, past surgical history and problem list. Problem list updated.  Objective:   Filed Vitals:   12/20/15 1002  BP: 112/63  Pulse: 73  Temp: 97.6 F (36.4 C)  Weight: 153 lb 1.6 oz (69.446 kg)    Fetal Status: Fetal Heart Rate (bpm): 150 Fundal Height: 14 cm Movement: Present     General:  Alert, oriented and cooperative. Patient is in no acute distress.  Skin: Skin is warm and dry. No rash noted.   Cardiovascular: Normal heart rate noted  Respiratory: Normal respiratory effort, no problems with respiration noted  Abdomen: Soft, gravid, appropriate for gestational age. Pain/Pressure: Absent     Pelvic: Vag. Bleeding: None     Cervical exam deferred        Extremities: Normal range of motion.  Edema: None  Mental Status: Normal mood and affect. Normal behavior. Normal judgment and thought content.   Urinalysis: Urine Protein: Negative Urine Glucose: Negative  Assessment and Plan:  Pregnancy: G2P1001 at [redacted]w[redacted]d  1. Encounter for supervision of other normal pregnancy in first trimester Doing well Urged to take breaks at work and nutritious snacks between meals - AFP, Quad Screen - [place] MFM OB COMP + 14 WK; Future  Preterm labor symptoms and general obstetric precautions including but not limited to vaginal bleeding,  contractions, leaking of fluid and fetal movement were reviewed in detail with the patient. Please refer to After Visit Summary for other counseling recommendations.  Return in about 1 month (around 01/20/2016).   Danae Orleans, CNM

## 2015-12-20 NOTE — Patient Instructions (Addendum)
Second Trimester of Pregnancy The second trimester is from week 13 through week 28, months 4 through 6. The second trimester is often a time when you feel your best. Your body has also adjusted to being pregnant, and you begin to feel better physically. Usually, morning sickness has lessened or quit completely, you may have more energy, and you may have an increase in appetite. The second trimester is also a time when the fetus is growing rapidly. At the end of the sixth month, the fetus is about 9 inches long and weighs about 1 pounds. You will likely begin to feel the baby move (quickening) between 18 and 20 weeks of the pregnancy. BODY CHANGES Your body goes through many changes during pregnancy. The changes vary from woman to woman.   Your weight will continue to increase. You will notice your lower abdomen bulging out.  You may begin to get stretch marks on your hips, abdomen, and breasts.  You may develop headaches that can be relieved by medicines approved by your health care provider.  You may urinate more often because the fetus is pressing on your bladder.  You may develop or continue to have heartburn as a result of your pregnancy.  You may develop constipation because certain hormones are causing the muscles that push waste through your intestines to slow down.  You may develop hemorrhoids or swollen, bulging veins (varicose veins).  You may have back pain because of the weight gain and pregnancy hormones relaxing your joints between the bones in your pelvis and as a result of a shift in weight and the muscles that support your balance.  Your breasts will continue to grow and be tender.  Your gums may bleed and may be sensitive to brushing and flossing.  Dark spots or blotches (chloasma, mask of pregnancy) may develop on your face. This will likely fade after the baby is born.  A dark line from your belly button to the pubic area (linea nigra) may appear. This will likely fade  after the baby is born.  You may have changes in your hair. These can include thickening of your hair, rapid growth, and changes in texture. Some women also have hair loss during or after pregnancy, or hair that feels dry or thin. Your hair will most likely return to normal after your baby is born. WHAT TO EXPECT AT YOUR PRENATAL VISITS During a routine prenatal visit:  You will be weighed to make sure you and the fetus are growing normally.  Your blood pressure will be taken.  Your abdomen will be measured to track your baby's growth.  The fetal heartbeat will be listened to.  Any test results from the previous visit will be discussed. Your health care provider may ask you:  How you are feeling.  If you are feeling the baby move.  If you have had any abnormal symptoms, such as leaking fluid, bleeding, severe headaches, or abdominal cramping.  If you are using any tobacco products, including cigarettes, chewing tobacco, and electronic cigarettes.  If you have any questions. Other tests that may be performed during your second trimester include:  Blood tests that check for:  Low iron levels (anemia).  Gestational diabetes (between 24 and 28 weeks).  Rh antibodies.  Urine tests to check for infections, diabetes, or protein in the urine.  An ultrasound to confirm the proper growth and development of the baby.  An amniocentesis to check for possible genetic problems.  Fetal screens for spina bifida   and Down syndrome.  HIV (human immunodeficiency virus) testing. Routine prenatal testing includes screening for HIV, unless you choose not to have this test. HOME CARE INSTRUCTIONS   Avoid all smoking, herbs, alcohol, and unprescribed drugs. These chemicals affect the formation and growth of the baby.  Do not use any tobacco products, including cigarettes, chewing tobacco, and electronic cigarettes. If you need help quitting, ask your health care provider. You may receive  counseling support and other resources to help you quit.  Follow your health care provider's instructions regarding medicine use. There are medicines that are either safe or unsafe to take during pregnancy.  Exercise only as directed by your health care provider. Experiencing uterine cramps is a good sign to stop exercising.  Continue to eat regular, healthy meals.  Wear a good support bra for breast tenderness.  Do not use hot tubs, steam rooms, or saunas.  Wear your seat belt at all times when driving.  Avoid raw meat, uncooked cheese, cat litter boxes, and soil used by cats. These carry germs that can cause birth defects in the baby.  Take your prenatal vitamins.  Take 1500-2000 mg of calcium daily starting at the 20th week of pregnancy until you deliver your baby.  Try taking a stool softener (if your health care provider approves) if you develop constipation. Eat more high-fiber foods, such as fresh vegetables or fruit and whole grains. Drink plenty of fluids to keep your urine clear or pale yellow.  Take warm sitz baths to soothe any pain or discomfort caused by hemorrhoids. Use hemorrhoid cream if your health care provider approves.  If you develop varicose veins, wear support hose. Elevate your feet for 15 minutes, 3-4 times a day. Limit salt in your diet.  Avoid heavy lifting, wear low heel shoes, and practice good posture.  Rest with your legs elevated if you have leg cramps or low back pain.  Visit your dentist if you have not gone yet during your pregnancy. Use a soft toothbrush to brush your teeth and be gentle when you floss.  A sexual relationship may be continued unless your health care provider directs you otherwise.  Continue to go to all your prenatal visits as directed by your health care provider. SEEK MEDICAL CARE IF:   You have dizziness.  You have mild pelvic cramps, pelvic pressure, or nagging pain in the abdominal area.  You have persistent nausea,  vomiting, or diarrhea.  You have a bad smelling vaginal discharge.  You have pain with urination. SEEK IMMEDIATE MEDICAL CARE IF:   You have a fever.  You are leaking fluid from your vagina.  You have spotting or bleeding from your vagina.  You have severe abdominal cramping or pain.  You have rapid weight gain or loss.  You have shortness of breath with chest pain.  You notice sudden or extreme swelling of your face, hands, ankles, feet, or legs.  You have not felt your baby move in over an hour.  You have severe headaches that do not go away with medicine.  You have vision changes.   This information is not intended to replace advice given to you by your health care provider. Make sure you discuss any questions you have with your health care provider.   Document Released: 11/05/2001 Document Revised: 12/02/2014 Document Reviewed: 01/12/2013 Elsevier Interactive Patient Education 2016 Elsevier Inc. Morning Sickness Morning sickness is when you feel sick to your stomach (nauseous) during pregnancy. This nauseous feeling may or may not  come with vomiting. It often occurs in the morning but can be a problem any time of day. Morning sickness is most common during the first trimester, but it may continue throughout pregnancy. While morning sickness is unpleasant, it is usually harmless unless you develop severe and continual vomiting (hyperemesis gravidarum). This condition requires more intense treatment.  CAUSES  The cause of morning sickness is not completely known but seems to be related to normal hormonal changes that occur in pregnancy. RISK FACTORS You are at greater risk if you:  Experienced nausea or vomiting before your pregnancy.  Had morning sickness during a previous pregnancy.  Are pregnant with more than one baby, such as twins. TREATMENT  Do not use any medicines (prescription, over-the-counter, or herbal) for morning sickness without first talking to your  health care provider. Your health care provider may prescribe or recommend:  Vitamin B6 supplements.  Anti-nausea medicines.  The herbal medicine ginger. HOME CARE INSTRUCTIONS   Only take over-the-counter or prescription medicines as directed by your health care provider.  Taking multivitamins before getting pregnant can prevent or decrease the severity of morning sickness in most women.  Eat a piece of dry toast or unsalted crackers before getting out of bed in the morning.  Eat five or six small meals a day.  Eat dry and bland foods (rice, baked potato). Foods high in carbohydrates are often helpful.  Do not drink liquids with your meals. Drink liquids between meals.  Avoid greasy, fatty, and spicy foods.  Get someone to cook for you if the smell of any food causes nausea and vomiting.  If you feel nauseous after taking prenatal vitamins, take the vitamins at night or with a snack.  Snack on protein foods (nuts, yogurt, cheese) between meals if you are hungry.  Eat unsweetened gelatins for desserts.  Wearing an acupressure wristband (worn for sea sickness) may be helpful.  Acupuncture may be helpful.  Do not smoke.  Get a humidifier to keep the air in your house free of odors.  Get plenty of fresh air. SEEK MEDICAL CARE IF:   Your home remedies are not working, and you need medicine.  You feel dizzy or lightheaded.  You are losing weight. SEEK IMMEDIATE MEDICAL CARE IF:   You have persistent and uncontrolled nausea and vomiting.  You pass out (faint). MAKE SURE YOU:  Understand these instructions.  Will watch your condition.  Will get help right away if you are not doing well or get worse.   This information is not intended to replace advice given to you by your health care provider. Make sure you discuss any questions you have with your health care provider.   Document Released: 01/02/2007 Document Revised: 11/16/2013 Document Reviewed:  04/28/2013 Elsevier Interactive Patient Education [employer]

## 2015-12-20 NOTE — Progress Notes (Signed)
Pt reports occasional dizziness and nausea.

## 2015-12-21 LAB — AFP, QUAD SCREEN
AFP: 14.3 ng/mL
Age Alone: 1:1070 {titer}
Curr Gest Age: 15.1 wks.days
Down Syndrome Scr Risk Est: 1:721 {titer}
HCG, Total: 45.41 IU/mL
INH: 99.4 pg/mL
Interpretation-AFP: NEGATIVE
MoM for AFP: 0.54
MoM for INH: 0.67
MoM for hCG: 1.14
Open Spina bifida: NEGATIVE
Osb Risk: <1:27300 {titer}
Tri 18 Scr Risk Est: NEGATIVE
Trisomy 18 (Edward) Syndrome Interp.: 1:4580 {titer}
uE3 Mom: 0.54
uE3 Value: 0.43 ng/mL

## 2016-01-09 ENCOUNTER — Telehealth: Payer: Self-pay

## 2016-01-09 NOTE — Telephone Encounter (Signed)
Pt called requesting a Rx for a headache, nausea, and dizziness.  Called pt and advised pt that she can take unisom 25 mg tablet at bedtime, vit B6 100 mg tablet po bid for her nausea.  I also advised Tylenol 500 mg tablet not to take more than 3,000 mg in one day for her headache.  And I advised pt to drink plenty of water and change positions slowly.   I informed pt that we can re elevated her sx at her next appt scheduled on 01/17/16.  Pt agreed.

## 2016-01-17 ENCOUNTER — Other Ambulatory Visit: Payer: Self-pay | Admitting: Obstetrics and Gynecology

## 2016-01-17 ENCOUNTER — Ambulatory Visit (INDEPENDENT_AMBULATORY_CARE_PROVIDER_SITE_OTHER): Payer: BLUE CROSS/BLUE SHIELD | Admitting: Student

## 2016-01-17 ENCOUNTER — Ambulatory Visit (HOSPITAL_COMMUNITY)
Admission: RE | Admit: 2016-01-17 | Discharge: 2016-01-17 | Disposition: A | Discharge status: Home / Self Care | Payer: BLUE CROSS/BLUE SHIELD | Source: Ambulatory Visit | Attending: Obstetrics and Gynecology | Admitting: Obstetrics and Gynecology

## 2016-01-17 ENCOUNTER — Encounter: Payer: Self-pay | Admitting: Student

## 2016-01-17 VITALS — BP 120/68 | HR 63 | Temp 97.6°F | Wt 152.1 lb

## 2016-01-17 DIAGNOSIS — Z3689 Encounter for other specified antenatal screening: Secondary | ICD-10-CM

## 2016-01-17 DIAGNOSIS — Z3A19 19 weeks gestation of pregnancy: Secondary | ICD-10-CM

## 2016-01-17 DIAGNOSIS — Z36 Encounter for antenatal screening of mother: Secondary | ICD-10-CM | POA: Insufficient documentation

## 2016-01-17 DIAGNOSIS — Z3481 Encounter for supervision of other normal pregnancy, first trimester: Secondary | ICD-10-CM | POA: Diagnosis not present

## 2016-01-17 DIAGNOSIS — Z3A20 20 weeks gestation of pregnancy: Secondary | ICD-10-CM

## 2016-01-17 DIAGNOSIS — O219 Vomiting of pregnancy, unspecified: Secondary | ICD-10-CM | POA: Diagnosis not present

## 2016-01-17 LAB — CBC
HCT: 37.3 % (ref 36.0–46.0)
Hemoglobin: 12.7 g/dL (ref 12.0–15.0)
MCH: 31.7 pg (ref 26.0–34.0)
MCHC: 34 g/dL (ref 30.0–36.0)
MCV: 93 fL (ref 78.0–100.0)
MPV: 10.8 fL (ref 8.6–12.4)
Platelets: 202 10*3/uL (ref 150–400)
RBC: 4.01 MIL/uL (ref 3.87–5.11)
RDW: 14.2 % (ref 11.5–15.5)
WBC: 8.3 10*3/uL (ref 4.0–10.5)

## 2016-01-17 LAB — POCT URINALYSIS DIP (DEVICE)
Bilirubin Urine: NEGATIVE
Glucose, UA: NEGATIVE mg/dL
Ketones, ur: NEGATIVE mg/dL
Nitrite: NEGATIVE
Protein, ur: NEGATIVE mg/dL
Specific Gravity, Urine: 1.01 (ref 1.005–1.030)
Urobilinogen, UA: 0.2 mg/dL (ref 0.0–1.0)
pH: 7 (ref 5.0–8.0)

## 2016-01-17 MED ORDER — ONDANSETRON 8 MG PO TBDP: 8.0000 mg | ORAL_TABLET | Freq: Three times a day (TID) | ORAL | Status: DC | PRN

## 2016-01-17 NOTE — Patient Instructions (Addendum)
Mareos (Dizziness) Los mareos son un problema muy frecuente. Se trata de una sensacin de inestabilidad o de desvanecimiento. Puede sentir que se va a desmayar. Un mareo puede provocarle una lesin si se tropieza o se cae. Las Dealer de todas las edades pueden sufrir Research scientist (life sciences), Biomedical engineer es ms frecuente en los adultos [place]. Esta afeccin puede tener muchas causas, entre las que se pueden [employer], la deshidratacin y [place]. INSTRUCCIONES PARA EL CUIDADO EN EL HOGAR Estas indicaciones pueden ayudarlo con el trastorno: Comida y bebida  Beba suficiente lquido para Pharmacologist la orina clara o de color amarillo plido. Esto evita la deshidratacin. Trate de beber ms lquidos transparentes, como agua.  No beba alcohol.  Limite el consumo de cafena si el mdico se lo indica.  Limite el consumo de sal si el mdico se lo indica. Actividad  Evite los movimientos rpidos.  Levntese de las sillas con lentitud y apyese hasta sentirse bien.  Por la maana, sintese primero a un lado de la cama. Cuando se sienta bien, pngase lentamente de [address] se sostiene de algo, hasta que sepa que ha logrado el equilibrio.  Mueva las piernas con frecuencia si debe estar de pie en un lugar durante mucho tiempo. Mientras est de pie, contraiga y relaje los msculos de las piernas.  No conduzca vehculos ni opere maquinaria pesada si se siente mareado.  Evite agacharse si se siente mareado. En su casa, coloque los objetos de modo que le resulte fcil alcanzarlos sin Public librarian. Estilo de vida  No consuma ningn producto que contenga tabaco, lo que incluye cigarrillos, tabaco de Theatre manager o Administrator, Civil Service. Si necesita ayuda para dejar de fumar, consulte al [employer].  Trate de reducir el nivel de estrs practicando actividades como el yoga o la meditacin. Hable con el mdico si necesita ayuda. Instrucciones generales  Controle sus mareos para ver si hay cambios.  Tome los  medicamentos solamente como se lo haya indicado el mdico. Hable con el mdico si cree que los medicamentos que est tomando son la causa de sus mareos.  Infrmele a un amigo o a un familiar si se siente mareado. Pdale a esta persona que llame al mdico si observa cambios en su comportamiento.  Concurra a todas las visitas de control como se lo haya indicado el mdico. Esto es importante. SOLICITE ATENCIN MDICA SI:  Los [employer].  Los [employer] o la sensacin de Production assistant, radio.  Siente nuseas.  Ha perdido la audicin.  Aparecen nuevos sntomas.  Cuando est de pie se siente inestable o que la habitacin da vueltas. SOLICITE ATENCIN MDICA DE INMEDIATO SI:  Vomita o tiene diarrea y no puede comer ni beber nada.  Tiene dificultad para hablar, para caminar, para tragar o para [employer], las manos o las piernas.  Siente una debilidad generalizada.  No piensa con claridad o tiene dificultades para armar oraciones. Es posible que un amigo o un familiar adviertan que esto ocurre.  Tiene dolor de pecho, dolor abdominal, sudoracin o Company secretary.  Hay cambios en la visin.  Observa un sangrado.  Tiene dolores de [place].  Tiene dolor o rigidez en el cuello.  Tiene fiebre.   Esta informacin no tiene Theme park manager el consejo del mdico. Asegrese de hacerle al mdico cualquier pregunta que tenga.   Document Released: 11/11/2005 Document Revised: 03/28/2015 Elsevier Interactive Patient Education 2016 [employer]. Tornado trimestre de Psychiatrist (Second Trimester of Pregnancy) El segundo trimestre va desde la [phone]  la 28, desde el cuarto hasta el sexto mes, y suele ser el momento en el que mejor se siente. Su organismo se ha adaptado a Charity fundraiser y comienza a Diplomatic Services operational officer. En general, las nuseas matutinas han disminuido o han desaparecido completamente, puede tener ms energa y un aumento de apetito. El segundo  trimestre es tambin la poca en la que el feto se desarrolla rpidamente. Hacia el final del sexto mes, el feto mide aproximadamente 9pulgadas (23cm) y pesa alrededor de 1 libras (700g). Es probable que sienta que el beb se Teacher, English as a foreign language (da pataditas) entre las 18 y 20semanas del Psychiatrist. CAMBIOS EN EL ORGANISMO Su organismo atraviesa por muchos cambios durante el [place], y estos varan de Neomia Dear mujer a Educational psychologist.   Seguir [employer]. Notar que la parte baja del abdomen sobresale.  Podrn aparecer las primeras [employer] caderas, el abdomen y las [place].  Es posible que tenga dolores de cabeza que pueden aliviarse con los medicamentos que el mdico le permita tomar.  Tal vez tenga necesidad de orinar con ms frecuencia porque el feto est ejerciendo presin Ambulance person.  Debido al Vanetta Mulders podr sentir Anthoney Harada estomacal con frecuencia.  Puede estar estreida, ya que ciertas hormonas enlentecen los movimientos de los msculos que [employer] desechos a travs de los intestinos.  Pueden aparecer hemorroides o abultarse e hincharse las venas (venas varicosas).  Puede tener dolor de espalda que se debe al [employer]igroup de peso y a que las hormonas del Management consultant las articulaciones entre los huesos de la pelvis, y Public librarian consecuencia de la modificacin del peso y los msculos que mantienen el equilibrio.  Las [employer] seguirn creciendo y Development worker, community.  Las Veterinary surgeon y estar sensibles al cepillado y al hilo dental.  Pueden aparecer zonas oscuras o manchas (cloasma, mscara del Psychiatrist) en el rostro que probablemente se atenuar despus del nacimiento del beb.  Es posible que se forme una lnea oscura desde el ombligo hasta la zona del pubis (linea nigra) que probablemente se atenuar despus del nacimiento del beb.  Tal vez haya cambios en el cabello que pueden incluir su engrosamiento, crecimiento rpido y cambios en la textura. Adems, a algunas mujeres se les cae el  cabello durante o despus del embarazo, o tienen el cabello seco o fino. Lo ms probable es que el cabello se le normalice despus del nacimiento del beb. QU DEBE ESPERAR EN LAS CONSULTAS PRENATALES Durante una visita prenatal de rutina:  La pesarn para asegurarse de que usted y el feto estn creciendo normalmente.  Le tomarn la presin arterial.  Le medirn el abdomen para controlar el desarrollo del beb.  Se escucharn los latidos cardacos fetales.  Se evaluarn los resultados de los estudios solicitados en visitas anteriores. El mdico puede preguntarle lo siguiente:  Cmo se siente.  Si siente los movimientos del beb.  Si ha tenido sntomas anormales, como prdida de lquido, [place], dolores de cabeza intensos o clicos abdominales.  Si est consumiendo algn producto que contenga tabaco, como cigarrillos, tabaco de Theatre manager y Administrator, Civil Service.  Si tiene [employer]. Otros estudios que podrn realizarse durante el segundo trimestre incluyen lo siguiente:  Anlisis de sangre para detectar lo siguiente:  Concentraciones de hierro bajas (anemia).  Diabetes gestacional (entre la semana 24 y la 28).  Anticuerpos Rh.  Anlisis de orina para detectar infecciones, diabetes o protenas en la orina.  Una ecografa para confirmar que el beb crece y se desarrolla correctamente.  Una amniocentesis para diagnosticar posibles problemas genticos.  Estudios del feto para descartar espina bfida y sndrome de Down.  Prueba del VIH (virus de inmunodeficiencia humana). Los exmenes prenatales de rutina incluyen la prueba de deteccin del VIH, a menos que decida no Futures trader. INSTRUCCIONES PARA EL CUIDADO EN EL HOGAR   Evite fumar, consumir hierbas, beber alcohol y tomar frmacos que no le hayan recetado. Estas sustancias qumicas afectan la formacin y el desarrollo del beb.  No consuma ningn producto que contenga tabaco, lo que incluye cigarrillos, tabaco de  Theatre manager y Administrator, Civil Service. Si necesita ayuda para dejar de fumar, consulte al [employer] Express. Puede recibir asesoramiento y otro tipo de recursos para dejar de fumar.  Siga las indicaciones del mdico en relacin con el uso de medicamentos. Durante el embarazo, hay medicamentos que son seguros de tomar y otros que no.  Haga ejercicio solamente como se lo haya indicado el mdico. Sentir clicos uterinos es un buen signo para Restaurant manager, fast food actividad fsica.  Contine comiendo alimentos sanos con regularidad.  Use un sostn que le brinde buen soporte si le [employer].  No se d baos de inmersin en agua caliente, baos turcos ni saunas.  Use el cinturn de seguridad en todo momento mientras conduce.  No coma carne cruda ni queso sin cocinar; evite el contacto con las bandejas sanitarias de los gatos y la tierra que estos animales usan. Estos elementos contienen grmenes que pueden causar defectos congnitos en el beb.  Tome las vitaminas prenatales.  Tome entre 1500 y  de calcio diariamente comenzando en la semana20 del embarazo [place].  Si est estreida, pruebe un laxante suave (si el mdico lo autoriza). Consuma ms alimentos ricos en fibra, como vegetales y frutas frescos y Radiation protection practitioner. Beba gran cantidad de lquido para mantener la orina de tono claro o color amarillo plido.  Dese baos de asiento con agua tibia para Engineer, materials o las molestias causadas por las hemorroides. Use una crema para las hemorroides si el mdico la autoriza.  Si tiene venas varicosas, use medias de descanso. Eleve los pies durante , 3 o 4veces por da. Limite el consumo de sal en su dieta.  No levante objetos pesados, use zapatos de tacones bajos y [address].  Descanse con las piernas elevadas si tiene calambres o dolor de cintura.  Visite a su dentista si an no lo ha Occupational hygienist. Use un cepillo de dientes blando para higienizarse  los dientes y psese el hilo dental con suavidad.  Puede seguir [employer], a menos que el mdico le indique lo contrario.  Concurra a todas las visitas prenatales segn las indicaciones de su mdico. SOLICITE ATENCIN MDICA SI:   [place].  Siente clicos leves, presin en la pelvis o dolor persistente en el abdomen.  Tiene nuseas, vmitos o diarrea persistentes.  Brett Fairy secrecin vaginal con mal olor.  Siente dolor al [employer]. SOLICITE ATENCIN MDICA DE INMEDIATO SI:   Tiene fiebre.  Tiene una prdida de lquido por la vagina.  Tiene sangrado o pequeas prdidas vaginales.  Siente dolor intenso o clicos en el abdomen.  Sube o baja de peso rpidamente.  Tiene dificultad para respirar y siente dolor de pecho.  Sbitamente se le hinchan mucho el rostro, las [place], los tobillos, los pies o las piernas.  No ha sentido los movimientos del beb durante Georgianne Fick.  Siente un dolor de cabeza intenso que no se alivia con medicamentos.  Su visin se modifica.   Esta informacin no tiene Theme park manager el consejo del mdico. Asegrese de hacerle al mdico cualquier pregunta que tenga.   Document Released: 08/21/2005 Document Revised: 12/02/2014 Elsevier Interactive Patient Education [employer]

## 2016-01-17 NOTE — Progress Notes (Signed)
Patient just had u/s in mfm Breastfeeding tip of the week reviewed

## 2016-01-17 NOTE — Progress Notes (Signed)
Subjective:  Tara Suarez is a 25 y.o. G2P1001 at [redacted]w[redacted]d being seen today for ongoing prenatal care.  She is currently monitored for the following issues for this low-risk pregnancy and has Encounter for supervision of other normal pregnancy in first trimester on her problem list.  Patient reports occassional dizziness & nausea/vomiting. Intermittent episodes of dizziness x 2 weeks. Occurs mostly if she's walking around a lot. Doesn't happen every day. Denies syncope, vertigo, chest pain, SOB, or palpitations. Also some n/v this week. Vomited this morning. Denies diarrhea, abdominal pain, constipation, or fever.   Contractions: Not present. Vag. Bleeding: None.  Movement: Present. Denies leaking of fluid.   The following portions of the patient's history were reviewed and updated as appropriate: allergies, current medications, past family history, past medical history, past social history, past surgical history and problem list. Problem list updated.  Objective:   Filed Vitals:   01/17/16 1058  BP: 120/68  Pulse: 63  Temp: 97.6 F (36.4 C)  Weight: 152 lb 1.6 oz (68.992 kg)    Fetal Status:   Fundal Height: 19 cm Movement: Present    FHT 143 by ultrasound today in MFM  General:  Alert, oriented and cooperative. Patient is in no acute distress.  Skin: Skin is warm and dry. No rash noted.   Cardiovascular: Normal heart rate noted  Respiratory: Normal respiratory effort, no problems with respiration noted  Abdomen: Soft, gravid, appropriate for gestational age. Pain/Pressure: Present   Fundal height 19 cm  Pelvic: Vag. Bleeding: None     Cervical exam deferred        Extremities: Normal range of motion.  Edema: None  Mental Status: Normal mood and affect. Normal behavior. Normal judgment and thought content.   Urinalysis: Urine Protein: Negative Urine Glucose: Negative  Assessment and Plan:  Pregnancy: G2P1001 at [redacted]w[redacted]d  1. Encounter for supervision of other normal pregnancy in  first trimester  - CBC  2. Nausea and vomiting during pregnancy  - ondansetron (ZOFRAN ODT) 8 MG disintegrating tablet; Take 1 tablet (8 mg total) by mouth every 8 (eight) hours as needed for nausea or vomiting.  Dispense: 20 tablet; Refill: 0  Preterm labor symptoms and general obstetric precautions including but not limited to vaginal bleeding, contractions, leaking of fluid and fetal movement were reviewed in detail with the patient. Please refer to After Visit Summary for other counseling recommendations.  Return in about 4 weeks (around 02/14/2016) for Routine OB.   Judeth Horn, NP

## 2016-02-14 ENCOUNTER — Ambulatory Visit (INDEPENDENT_AMBULATORY_CARE_PROVIDER_SITE_OTHER): Payer: BLUE CROSS/BLUE SHIELD | Admitting: Family

## 2016-02-14 ENCOUNTER — Encounter: Payer: Self-pay | Admitting: Family Medicine

## 2016-02-14 VITALS — BP 118/68 | HR 75 | Temp 98.6°F | Wt 155.9 lb

## 2016-02-14 DIAGNOSIS — Z3481 Encounter for supervision of other normal pregnancy, first trimester: Secondary | ICD-10-CM | POA: Diagnosis not present

## 2016-02-14 DIAGNOSIS — O99340 Other mental disorders complicating pregnancy, unspecified trimester: Secondary | ICD-10-CM | POA: Insufficient documentation

## 2016-02-14 DIAGNOSIS — F419 Anxiety disorder, unspecified: Secondary | ICD-10-CM | POA: Insufficient documentation

## 2016-02-14 DIAGNOSIS — O99342 Other mental disorders complicating pregnancy, second trimester: Secondary | ICD-10-CM

## 2016-02-14 LAB — POCT URINALYSIS DIP (DEVICE)
Glucose, UA: NEGATIVE mg/dL
Ketones, ur: NEGATIVE mg/dL
Nitrite: NEGATIVE
Protein, ur: 30 mg/dL — AB
Specific Gravity, Urine: >=1.03 (ref 1.005–1.030)
Urobilinogen, UA: 0.2 mg/dL (ref 0.0–1.0)
pH: 5.5 (ref 5.0–8.0)

## 2016-02-14 MED ORDER — CYCLOBENZAPRINE HCL 10 MG PO TABS: 10.0000 mg | ORAL_TABLET | Freq: Every day | ORAL | Status: DC

## 2016-02-14 NOTE — Progress Notes (Signed)
Subjective:  Tara Suarez is a 25 y.o. G2P1001 at 248w0d being seen today for ongoing prenatal care.  She is currently monitored for the following issues for this low-risk pregnancy and has Encounter for supervision of other normal pregnancy in first trimester on her problem list.  Patient reports racing mind at night.  Also starting to experience anxiety in crowds.  +pain at back of neck.  Denies fever or additional symptoms.  Contractions: Not present. Vag. Bleeding: None.  Movement: Present. Denies leaking of fluid.   The following portions of the patient's history were reviewed and updated as appropriate: allergies, current medications, past family history, past medical history, past social history, past surgical history and problem list. Problem list updated.  Objective:   Filed Vitals:   02/14/16 0933  BP: 118/68  Pulse: 75  Temp: 98.6 F (37 C)  Weight: 155 lb 14.4 oz (70.716 kg)    Fetal Status: Fetal Heart Rate (bpm): 138 Fundal Height: 25 cm Movement: Present     General:  Alert, oriented and cooperative. Patient is in no acute distress.  Skin: Skin is warm and dry. No rash noted.   MSK Tenderness with palpation at base of neck  Cardiovascular: Normal heart rate noted  Respiratory: Normal respiratory effort, no problems with respiration noted  Abdomen: Soft, gravid, appropriate for gestational age. Pain/Pressure: Absent     Pelvic: Vag. Bleeding: None     Cervical exam deferred        Extremities: Normal range of motion.  Edema: None  Mental Status: Normal mood and affect. Normal behavior. Normal judgment and thought content.   Urinalysis: Urine Protein: 1+ Urine Glucose: Negative  Assessment and Plan:  Pregnancy: G2P1001 at 488w0d  1. Encounter for supervision of other normal pregnancy in first trimester - Continue observations  2.  Anxiety  [facility]  3.  Neck Pain - Flexeril  Preterm labor symptoms and general obstetric  precautions including but not limited to vaginal bleeding, contractions, leaking of fluid and fetal movement were reviewed in detail with the patient. Please refer to After Visit Summary for other counseling recommendations.  Return in about 4 weeks (around 03/13/2016).   Eino FarberWalidah Kennith GainN Karim, CNM

## 2016-02-14 NOTE — Progress Notes (Signed)
Educated pt on Rooming In  

## 2016-02-22 ENCOUNTER — Ambulatory Visit: Payer: BLUE CROSS/BLUE SHIELD

## 2016-03-13 ENCOUNTER — Encounter: Payer: Self-pay | Admitting: Certified Nurse Midwife

## 2016-03-13 ENCOUNTER — Ambulatory Visit (INDEPENDENT_AMBULATORY_CARE_PROVIDER_SITE_OTHER): Payer: BLUE CROSS/BLUE SHIELD | Admitting: Certified Nurse Midwife

## 2016-03-13 VITALS — BP 114/64 | HR 64 | Wt 160.5 lb

## 2016-03-13 DIAGNOSIS — Z23 Encounter for immunization: Secondary | ICD-10-CM

## 2016-03-13 DIAGNOSIS — Z3481 Encounter for supervision of other normal pregnancy, first trimester: Secondary | ICD-10-CM

## 2016-03-13 LAB — POCT URINALYSIS DIP (DEVICE)
Bilirubin Urine: NEGATIVE
Glucose, UA: NEGATIVE mg/dL
Hgb urine dipstick: NEGATIVE
Ketones, ur: NEGATIVE mg/dL
Nitrite: NEGATIVE
Protein, ur: NEGATIVE mg/dL
Specific Gravity, Urine: 1.02 (ref 1.005–1.030)
Urobilinogen, UA: 1 mg/dL (ref 0.0–1.0)
pH: 7 (ref 5.0–8.0)

## 2016-03-13 LAB — CBC
HCT: 35.9 % (ref 35.0–45.0)
Hemoglobin: 12.3 g/dL (ref 11.7–15.5)
MCH: 32.3 pg (ref 27.0–33.0)
MCHC: 34.3 g/dL (ref 32.0–36.0)
MCV: 94.2 fL (ref 80.0–100.0)
MPV: 10.6 fL (ref 7.5–12.5)
Platelets: 187 10*3/uL (ref 140–400)
RBC: 3.81 MIL/uL (ref 3.80–5.10)
RDW: 13.4 % (ref 11.0–15.0)
WBC: 8.7 10*3/uL (ref 3.8–10.8)

## 2016-03-13 MED ORDER — TETANUS-DIPHTH-ACELL PERTUSSIS 5-2.5-18.5 LF-MCG/0.5 IM SUSP
0.5000 mL | Freq: Once | INTRAMUSCULAR | Status: AC
  Administered 2016-03-13: 0.5 mL via INTRAMUSCULAR

## 2016-03-13 NOTE — Progress Notes (Signed)
Subjective:  Tara Suarez is a 25 y.o. G2P1001 at 4037w0d being seen today for ongoing prenatal care.  She is currently monitored for the following issues for this low-risk pregnancy and has Encounter for supervision of other normal pregnancy in first trimester and Anxiety in pregnancy, antepartum on her problem list.  Patient reports no complaints.  Contractions: Not present. Vag. Bleeding: None.  Movement: Present. Denies leaking of fluid.   The following portions of the patient's history were reviewed and updated as appropriate: allergies, current medications, past family history, past medical history, past social history, past surgical history and problem list. Problem list updated.  Objective:   Filed Vitals:   03/13/16 0934  BP: 114/64  Pulse: 64  Weight: 160 lb 8 oz (72.802 kg)    Fetal Status: Fetal Heart Rate (bpm): 156   Movement: Present     General:  Alert, oriented and cooperative. Patient is in no acute distress.  Skin: Skin is warm and dry. No rash noted.   Cardiovascular: Normal heart rate noted  Respiratory: Normal respiratory effort, no problems with respiration noted  Abdomen: Soft, gravid, appropriate for gestational age. Pain/Pressure: Absent     Pelvic: Vag. Bleeding: None     Cervical exam deferred        Extremities: Normal range of motion.  Edema: None  Mental Status: Normal mood and affect. Normal behavior. Normal judgment and thought content.   Urinalysis: Urine Protein: Negative Urine Glucose: Negative  Assessment and Plan:  Pregnancy: G2P1001 at 3737w0d  1. Encounter for supervision of other normal pregnancy in first trimester  - Glucose Tolerance, 1 HR (50g) w/o Fasting - CBC - RPR - HIV antibody (with reflex)  Preterm labor symptoms and general obstetric precautions including but not limited to vaginal bleeding, contractions, leaking of fluid and fetal movement were reviewed in detail with the patient. Please refer to After Visit Summary for  other counseling recommendations.  Return in about 2 weeks (around 03/27/2016).   Rhea PinkLori A Jden Want, CNM

## 2016-03-13 NOTE — Progress Notes (Signed)
1 hour due at 10:38

## 2016-03-13 NOTE — Progress Notes (Signed)
28 wk labs today and packet given Tdap consented and info given

## 2016-03-13 NOTE — Patient Instructions (Signed)
Prueba de tolerancia a la glucosa durante el embarazo (Glucose Tolerance Test During Pregnancy) La prueba de tolerancia a la glucosa es un anlisis de sangre que se usa para determinar si ha contrado un tipo de diabetes durante el embarazo (diabetes gestacional). Esto es cuando el cuerpo no procesa de forma correcta el azcar (glucosa) en los alimentos que come, lo cual provoca niveles sanguneos altos de glucosa. Por lo general, la PTG se realiza despus de haberse hecho una prueba de glucosa de 1 hora, cuyos resultados indican que posiblemente tiene diabetes gestacional. Tambin se puede hacer si:  Tiene antecedentes de haber parido bebs muy grandes o antecedentes de muerte fetal repetida (beb nacido muerto).  Tiene signos o sntomas de diabetes, tales como:  Cambios en la visin.  Hormigueo o adormecimiento en las manos o los pies.  Cambios en el hambre, la sed y la miccin que no se explican por el embarazo. La PTG dura unas 3 horas. Le darn para beber una solucin de agua y azcar al principio de la prueba. Le extraern sangre antes de que beba la solucin, y 1, 2 y 3 horas despus de beberla. No se le permitir comer ni beber nada ms durante la prueba. Debe permanecer en el lugar en que se realiza la prueba para asegurarse de que la sangre se extraiga puntualmente. Tambin debe evitar realizar ejercicios durante la prueba porque esto puede alterar los resultados. PREPARACIN PARA EL ESTUDIO  Coma normalmente durante 3 das antes de la PTG e incluya muchos alimentos ricos en carbohidratos. No coma ni beba nada, excepto agua, durante las ltimas 12 horas antes de la prueba. Adems, el mdico puede pedirle que deje de tomar ciertos medicamentos antes de la prueba. RESULTADOS  Es su responsabilidad retirar el resultado del estudio. Consulte en el laboratorio o en el departamento en el que fue realizado el estudio cundo y cmo podr obtener los resultados. Comunquese con el mdico si tiene  preguntas sobre los resultados.  Rango de valores normales Los rangos para los valores normales pueden variar entre diferentes laboratorios y hospitales. Siempre debe consultar a su mdico despus de realizarse un anlisis u otros estudios para saber si los valores de sus resultados se consideran dentro de los lmites normales. Los niveles normales de glucemia son los siguientes:  Ayuno: menos de 105mg/dl.  Una hora despus de beber la solucin: menos de 190mg/dl.  Dos horas despus de beber la solucin: menos de 165mg/dl.  Tres horas despus de beber la solucin: menos de 145mg/dl. Algunas sustancias pueden alterar los resultados de la PTG. Estas pueden incluir lo siguiente:  Medicamentos para la presin arterial y la insuficiencia cardaca, como betabloqueantes, furosemida y tiacidas.  Antiinflamatorios, como aspirina.  Nicotina.  Algunos medicamentos psiquitricos. Significado de los resultados que estn fuera de los rangos de los valores normales Los resultados de la PTG por debajo de los valores normales pueden indicar varios problemas de salud, tales como:   Diabetes gestacional.  Respuesta al estrs agudo.  Sndrome de [place].  Tumores como feocromocitoma o glucagonoma.  Problemas renales de larga duracin.  Pancreatitis.  Hipertiroidismo.  Infeccin actual. Hable con el mdico sobre los resultados. El mdico utilizar los resultados para realizar un diagnstico y determinar un plan de tratamiento adecuado para usted.   Esta informacin no tiene como fin reemplazar el consejo del mdico. Asegrese de hacerle al mdico cualquier pregunta que tenga.   Document Released: 05/12/2012 Document Revised: 12/02/2014 Elsevier Interactive Patient Education 2016 Elsevier Inc.  

## 2016-03-14 LAB — GLUCOSE TOLERANCE, 1 HOUR (50G) W/O FASTING: Glucose, 1 Hr, gestational: 74 mg/dL (ref <140)

## 2016-03-14 LAB — RPR

## 2016-03-14 LAB — HIV ANTIBODY (ROUTINE TESTING W REFLEX): HIV 1&2 Ab, 4th Generation: NONREACTIVE

## 2016-03-27 ENCOUNTER — Encounter: Payer: Self-pay | Admitting: Obstetrics and Gynecology

## 2016-03-27 ENCOUNTER — Ambulatory Visit (INDEPENDENT_AMBULATORY_CARE_PROVIDER_SITE_OTHER): Payer: BLUE CROSS/BLUE SHIELD | Admitting: Advanced Practice Midwife

## 2016-03-27 VITALS — BP 117/64 | HR 79 | Wt 160.6 lb

## 2016-03-27 DIAGNOSIS — Z3481 Encounter for supervision of other normal pregnancy, first trimester: Secondary | ICD-10-CM

## 2016-03-27 LAB — POCT URINALYSIS DIP (DEVICE)
Bilirubin Urine: NEGATIVE
Glucose, UA: NEGATIVE mg/dL
Hgb urine dipstick: NEGATIVE
Ketones, ur: NEGATIVE mg/dL
Nitrite: NEGATIVE
Protein, ur: NEGATIVE mg/dL
Specific Gravity, Urine: 1.02 (ref 1.005–1.030)
Urobilinogen, UA: 0.2 mg/dL (ref 0.0–1.0)
pH: 6 (ref 5.0–8.0)

## 2016-03-27 NOTE — Patient Instructions (Signed)

## 2016-03-27 NOTE — Progress Notes (Signed)
Subjective:  Tara Suarez is a 25 y.o. G2P1001 at 2053w0d being seen today for ongoing prenatal care.  She is currently monitored for the following issues for this low-risk pregnancy and has Encounter for supervision of other normal pregnancy in first trimester and Anxiety in pregnancy, antepartum on her problem list.  Patient reports no complaints.  Contractions: Not present. Vag. Bleeding: None.  Movement: Present. Denies leaking of fluid.   The following portions of the patient's history were reviewed and updated as appropriate: allergies, current medications, past family history, past medical history, past social history, past surgical history and problem list. Problem list updated.  Objective:   Filed Vitals:   03/27/16 0855  BP: 117/64  Pulse: 79  Weight: 160 lb 9.6 oz (72.848 kg)    Fetal Status: Fetal Heart Rate (bpm): 132 Fundal Height: 30 cm Movement: Present     General:  Alert, oriented and cooperative. Patient is in no acute distress.  Skin: Skin is warm and dry. No rash noted.   Cardiovascular: Normal heart rate noted  Respiratory: Normal respiratory effort, no problems with respiration noted  Abdomen: Soft, gravid, appropriate for gestational age. Pain/Pressure: Absent     Pelvic: Vag. Bleeding: None     Cervical exam deferred        Extremities: Normal range of motion.  Edema: None  Mental Status: Normal mood and affect. Normal behavior. Normal judgment and thought content.   Urinalysis: Urine Protein: Negative Urine Glucose: Negative  Assessment and Plan:  Pregnancy: G2P1001 at 5353w0d  1. Encounter for supervision of other normal pregnancy in first trimester   Preterm labor symptoms and general obstetric precautions including but not limited to vaginal bleeding, contractions, leaking of fluid and fetal movement were reviewed in detail with the patient. Please refer to After Visit Summary for other counseling recommendations.  Return in about 2 weeks (around  04/10/2016).   Hurshel PartyLisa A Leftwich-Kirby, CNM

## 2016-03-27 NOTE — Progress Notes (Signed)
Educated pt on Benefits of Breastfeeding Mom and [address]

## 2016-04-18 ENCOUNTER — Ambulatory Visit (INDEPENDENT_AMBULATORY_CARE_PROVIDER_SITE_OTHER): Payer: BLUE CROSS/BLUE SHIELD | Admitting: Family Medicine

## 2016-04-18 VITALS — BP 114/69 | HR 76 | Temp 98.3°F | Wt 165.0 lb

## 2016-04-18 DIAGNOSIS — Z3481 Encounter for supervision of other normal pregnancy, first trimester: Secondary | ICD-10-CM

## 2016-04-18 DIAGNOSIS — M542 Cervicalgia: Secondary | ICD-10-CM

## 2016-04-18 DIAGNOSIS — O9989 Other specified diseases and conditions complicating pregnancy, childbirth and the puerperium: Secondary | ICD-10-CM

## 2016-04-18 DIAGNOSIS — M9901 Segmental and somatic dysfunction of cervical region: Secondary | ICD-10-CM | POA: Diagnosis not present

## 2016-04-18 LAB — POCT URINALYSIS DIP (DEVICE)
Bilirubin Urine: NEGATIVE
Bilirubin Urine: NEGATIVE
Glucose, UA: NEGATIVE mg/dL
Glucose, UA: NEGATIVE mg/dL
Hgb urine dipstick: NEGATIVE
Hgb urine dipstick: NEGATIVE
Ketones, ur: NEGATIVE mg/dL
Ketones, ur: NEGATIVE mg/dL
Nitrite: NEGATIVE
Nitrite: NEGATIVE
Protein, ur: NEGATIVE mg/dL
Protein, ur: NEGATIVE mg/dL
Specific Gravity, Urine: 1.015 (ref 1.005–1.030)
Specific Gravity, Urine: 1.015 (ref 1.005–1.030)
Urobilinogen, UA: 0.2 mg/dL (ref 0.0–1.0)
Urobilinogen, UA: 0.2 mg/dL (ref 0.0–1.0)
pH: 6 (ref 5.0–8.0)
pH: 6 (ref 5.0–8.0)

## 2016-04-18 MED ORDER — CYCLOBENZAPRINE HCL 10 MG PO TABS: 10.0000 mg | ORAL_TABLET | Freq: Every day | ORAL | Status: DC

## 2016-04-18 NOTE — Progress Notes (Signed)
Large leukocytes noted on urinalysis.  

## 2016-04-18 NOTE — Progress Notes (Signed)
Subjective:  Tara Suarez is a 25 y.o. G2P1001 at 3328w1d being seen today for ongoing prenatal care.  She is currently monitored for the following issues for this low-risk pregnancy and has Encounter for supervision of other normal pregnancy in first trimester and Anxiety in pregnancy, antepartum on her problem list.  Patient reports right sided, non-radiating, aching neck pain.  Had flexeril, which was helpful..  Contractions: Not present. Vag. Bleeding: None.  Movement: Present. Denies leaking of fluid.   The following portions of the patient's history were reviewed and updated as appropriate: allergies, current medications, past family history, past medical history, past social history, past surgical history and problem list. Problem list updated.  Objective:   Filed Vitals:   04/18/16 0915  BP: 114/69  Pulse: 76  Temp: 98.3 F (36.8 C)  Weight: 165 lb (74.844 kg)    Fetal Status: Fetal Heart Rate (bpm): 140 Fundal Height: 33 cm Movement: Present     General:  Alert, oriented and cooperative. Patient is in no acute distress.  Skin: Skin is warm and dry. No rash noted.   Cardiovascular: Normal heart rate noted  Respiratory: Normal respiratory effort, no problems with respiration noted  Abdomen: Soft, gravid, appropriate for gestational age. Pain/Pressure: Absent     Pelvic: Vag. Bleeding: None     Cervical exam deferred        MSK Tenderness to right cervical paraspinals.  Decreased motion through the cervical and right upper thoracic spine.  Hypertonicity to paraspinal muscles on right  OSE: T3-4, FSRR.  Ribs 3-4 inhaled on right.  C3 FSRR.  OA ESRL  Extremities: Normal range of motion.  Edema: Trace  Mental Status: Normal mood and affect. Normal behavior. Normal judgment and thought content.   Urinalysis: Urine Protein: Negative Urine Glucose: Negative  Assessment and Plan:  Pregnancy: G2P1001 at 3128w1d  1. Encounter for supervision of other normal pregnancy in first  trimester FHT and FH normal.    2. Neck pain Continue flexeril  3. Somatic dysfunction of cervical region After patient permission, OMT done.  Pt tolerated well.  No complications.  4 areas treated.  Preterm labor symptoms and general obstetric precautions including but not limited to vaginal bleeding, contractions, leaking of fluid and fetal movement were reviewed in detail with the patient. Please refer to After Visit Summary for other counseling recommendations.  Return in about 2 weeks (around 05/02/2016) for LR OB f/u.   Levie HeritageJacob J Greogry Goodwyn, DO

## 2016-05-15 ENCOUNTER — Encounter: Payer: Self-pay | Admitting: Advanced Practice Midwife

## 2016-05-15 ENCOUNTER — Ambulatory Visit (INDEPENDENT_AMBULATORY_CARE_PROVIDER_SITE_OTHER): Payer: BLUE CROSS/BLUE SHIELD | Admitting: Advanced Practice Midwife

## 2016-05-15 VITALS — BP 121/79 | HR 70 | Wt 171.1 lb

## 2016-05-15 DIAGNOSIS — Z3493 Encounter for supervision of normal pregnancy, unspecified, third trimester: Secondary | ICD-10-CM

## 2016-05-15 DIAGNOSIS — O99342 Other mental disorders complicating pregnancy, second trimester: Secondary | ICD-10-CM

## 2016-05-15 DIAGNOSIS — F419 Anxiety disorder, unspecified: Secondary | ICD-10-CM

## 2016-05-15 DIAGNOSIS — O99343 Other mental disorders complicating pregnancy, third trimester: Secondary | ICD-10-CM

## 2016-05-15 LAB — POCT URINALYSIS DIP (DEVICE)
Bilirubin Urine: NEGATIVE
Glucose, UA: NEGATIVE mg/dL
Hgb urine dipstick: NEGATIVE
Ketones, ur: NEGATIVE mg/dL
Nitrite: NEGATIVE
Protein, ur: NEGATIVE mg/dL
Specific Gravity, Urine: 1.025 (ref 1.005–1.030)
Urobilinogen, UA: 0.2 mg/dL (ref 0.0–1.0)
pH: 6.5 (ref 5.0–8.0)

## 2016-05-15 LAB — OB RESULTS CONSOLE GBS: GBS: NEGATIVE

## 2016-05-15 NOTE — Patient Instructions (Signed)
   Psychological Services:   [facility] - [address] - [place]  [phone]    or          [facility]: [phone]  . [facility]: [phone]          [facility]           [address], [place]           ACCESS LINE: [phone]     (24 Hour) . Mobile Crisis:  . HELPLINES:  [facility] [place] [phone](336) [phone] [facility] [place] [phone] . [facility]             [facility] - [phone] or [phone]  [facility] - [phone] - [place] [phone]  [facility] [phone], [place] [phone]

## 2016-05-15 NOTE — Progress Notes (Signed)
Large leukocytes in urine.  

## 2016-05-15 NOTE — Progress Notes (Signed)
Subjective:  Tara Suarez is a 25 y.o. G2P1001 at 651w0d being seen today for ongoing prenatal care.  She is currently monitored for the following issues for this low-risk pregnancy and has Encounter for supervision of other normal pregnancy in first trimester and Anxiety in pregnancy, antepartum on her problem list.  Patient reports no complaints.  Contractions: Not present. Vag. Bleeding: None.  Movement: Present. Denies leaking of fluid.   The following portions of the patient's history were reviewed and updated as appropriate: allergies, current medications, past family history, past medical history, past social history, past surgical history and problem list. Problem list updated.  Objective:   Filed Vitals:   05/15/16 1041  BP: 121/79  Pulse: 70  Weight: 171 lb 1.6 oz (77.61 kg)    Fetal Status: Fetal Heart Rate (bpm): 155 Fundal Height: 36 cm Movement: Present  Presentation: Vertex  General:  Alert, oriented and cooperative. Patient is in no acute distress.  Skin: Skin is warm and dry. No rash noted.   Cardiovascular: Normal heart rate noted  Respiratory: Normal respiratory effort, no problems with respiration noted  Abdomen: Soft, gravid, appropriate for gestational age. Pain/Pressure: Absent     Pelvic: Cervical exam performed Dilation: 1 Effacement (%): Thick Station: -3  Extremities: Normal range of motion.  Edema: Trace  Mental Status: Normal mood and affect. Normal behavior. Normal judgment and thought content.   Urinalysis: Urine Protein: Negative Urine Glucose: Negative  Assessment and Plan:  Pregnancy: G2P1001 at 7551w0d  1. Supervision of low-risk pregnancy, third trimester  - Culture, beta strep (group b only) - GC/Chlamydia probe amp ([place]  2. Anxiety in pregnancy, antepartum, second trimester --[facility]. Reports she is doing well now, no anxiety.  Resources printed for pt with contact info for places to call.  Reviewed s/sx of postpartum depression.   Term labor symptoms and general obstetric precautions including but not limited to vaginal bleeding, contractions, leaking of fluid and fetal movement were reviewed in detail with the patient. Please refer to After Visit Summary for other counseling recommendations.  Return in about 1 week (around 05/22/2016).   Hurshel PartyLisa A Leftwich-Kirby, CNM

## 2016-05-15 NOTE — Progress Notes (Signed)
Educate pt on [place]  36 wk cultures

## 2016-05-16 LAB — GC/CHLAMYDIA PROBE AMP (~~LOC~~) NOT AT ARMC
Chlamydia: NEGATIVE
Neisseria Gonorrhea: NEGATIVE

## 2016-05-17 LAB — CULTURE, BETA STREP (GROUP B ONLY)

## 2016-05-21 ENCOUNTER — Ambulatory Visit (INDEPENDENT_AMBULATORY_CARE_PROVIDER_SITE_OTHER): Payer: BLUE CROSS/BLUE SHIELD | Admitting: Student

## 2016-05-21 ENCOUNTER — Encounter: Payer: Self-pay | Admitting: Student

## 2016-05-21 VITALS — BP 125/71 | HR 79 | Wt 173.5 lb

## 2016-05-21 DIAGNOSIS — Z3483 Encounter for supervision of other normal pregnancy, third trimester: Secondary | ICD-10-CM | POA: Diagnosis not present

## 2016-05-21 DIAGNOSIS — Z3481 Encounter for supervision of other normal pregnancy, first trimester: Secondary | ICD-10-CM

## 2016-05-21 LAB — POCT URINALYSIS DIP (DEVICE)
Bilirubin Urine: NEGATIVE
Glucose, UA: NEGATIVE mg/dL
Ketones, ur: NEGATIVE mg/dL
Nitrite: NEGATIVE
Protein, ur: NEGATIVE mg/dL
Specific Gravity, Urine: 1.015 (ref 1.005–1.030)
Urobilinogen, UA: 0.2 mg/dL (ref 0.0–1.0)
pH: 7 (ref 5.0–8.0)

## 2016-05-21 NOTE — Patient Instructions (Signed)
Braxton Hicks Contractions °Contractions of the uterus can occur throughout pregnancy. Contractions are not always a sign that you are in labor.  °WHAT ARE BRAXTON HICKS CONTRACTIONS?  °Contractions that occur before labor are called Braxton Hicks contractions, or false labor. Toward the end of pregnancy (32-34 weeks), these contractions can develop more often and may become more forceful. This is not true labor because these contractions do not result in opening (dilatation) and thinning of the cervix. They are sometimes difficult to tell apart from true labor because these contractions can be forceful and people have different pain tolerances. You should not feel embarrassed if you go to the hospital with false labor. Sometimes, the only way to tell if you are in true labor is for your health care provider to look for changes in the cervix. °If there are no prenatal problems or other health problems associated with the pregnancy, it is completely safe to be sent home with false labor and await the onset of true labor. °HOW CAN YOU TELL THE DIFFERENCE BETWEEN TRUE AND FALSE LABOR? °False Labor °· The contractions of false labor are usually shorter and not as hard as those of true labor.   °· The contractions are usually irregular.   °· The contractions are often felt in the front of the lower abdomen and in the groin.   °· The contractions may go away when you walk around or change positions while lying down.   °· The contractions get weaker and are shorter lasting as time goes on.   °· The contractions do not usually become progressively stronger, regular, and closer together as with true labor.   °True Labor °· Contractions in true labor last 30-70 seconds, become very regular, usually become more intense, and increase in frequency.   °· The contractions do not go away with walking.   °· The discomfort is usually felt in the top of the uterus and spreads to the lower abdomen and low back.   °· True labor can be  determined by your health care provider with an exam. This will show that the cervix is dilating and getting thinner.   °WHAT TO REMEMBER °· Keep up with your usual exercises and follow other instructions given by your health care provider.   °· Take medicines as directed by your health care provider.   °· Keep your regular prenatal appointments.   °· Eat and drink lightly if you think you are going into labor.   °· If Braxton Hicks contractions are making you uncomfortable:   °¨ Change your position from lying down or resting to walking, or from walking to resting.   °¨ Sit and rest in a tub of warm water.   °¨ Drink 2-3 glasses of water. Dehydration may cause these contractions.   °¨ Do slow and deep breathing several times an hour.   °WHEN SHOULD I SEEK IMMEDIATE MEDICAL CARE? °Seek immediate medical care if: °· Your contractions become stronger, more regular, and closer together.   °· You have fluid leaking or gushing from your vagina.   °· You have a fever.   °· You pass blood-tinged mucus.   °· You have vaginal bleeding.   °· You have continuous abdominal pain.   °· You have low back pain that you never had before.   °· You feel your baby's head pushing down and causing pelvic pressure.   °· Your baby is not moving as much as it used to.   °  °This information is not intended to replace advice given to you by your health care provider. Make sure you discuss any questions you have with your health care   provider. °  °Document Released: 11/11/2005 Document Revised: 11/16/2013 Document Reviewed: 08/23/2013 °Elsevier Interactive Patient Education ©2016 Elsevier Inc. ° °

## 2016-05-21 NOTE — Progress Notes (Signed)
Subjective:  Tara Suarez is a 25 y.o. G2P1001 at 4526w6d being seen today for ongoing prenatal care.  She is currently monitored for the following issues for this low-risk pregnancy and has Encounter for supervision of other normal pregnancy in first trimester and Anxiety in pregnancy, antepartum on her problem list.  Patient reports no complaints.  Contractions: Not present. Vag. Bleeding: None.  Movement: Present. Denies bleeding and leaking of fluid.   The following portions of the patient's history were reviewed and updated as appropriate: allergies, current medications, past family history, past medical history, past social history, past surgical history and problem list. Problem list updated.  Objective:   Filed Vitals:   05/21/16 1303  BP: 125/71  Pulse: 79  Weight: 173 lb 8 oz (78.699 kg)    Fetal Status: Fetal Heart Rate (bpm): 137   Movement: Present     General:  Alert, oriented and cooperative. Patient is in no acute distress.  Skin: Skin is warm and dry. No rash noted.   Cardiovascular: Normal heart rate noted  Respiratory: Normal respiratory effort, no problems with respiration noted  Abdomen: Soft, gravid, appropriate for gestational age. Pain/Pressure: Present     Pelvic: Cervical exam performed      1/thick/-3  Extremities: Normal range of motion.  Edema: Trace  Mental Status: Normal mood and affect. Normal behavior. Normal judgment and thought content.   Urinalysis: Urine Protein: Negative Urine Glucose: Negative  Assessment and Plan:  Pregnancy: G2P1001 at 2826w6d  1. Encounter for supervision of other normal pregnancy in first trimester - Continue routine antenatal care  Term labor symptoms and general obstetric precautions including but not limited to vaginal bleeding, contractions, leaking of fluid and fetal movement were reviewed in detail with the patient. Please refer to After Visit Summary for other counseling recommendations.  Return in about 1  week (around 05/28/2016) for Routine OB.   Judeth HornErin Katheleen Stella, NP

## 2016-05-21 NOTE — Progress Notes (Signed)
Pressure from baby movements

## 2016-05-27 ENCOUNTER — Encounter: Payer: Self-pay | Admitting: Family Medicine

## 2016-05-27 ENCOUNTER — Ambulatory Visit (INDEPENDENT_AMBULATORY_CARE_PROVIDER_SITE_OTHER): Payer: BLUE CROSS/BLUE SHIELD | Admitting: Obstetrics and Gynecology

## 2016-05-27 VITALS — BP 115/70 | HR 63 | Wt 173.9 lb

## 2016-05-27 DIAGNOSIS — Z3481 Encounter for supervision of other normal pregnancy, first trimester: Secondary | ICD-10-CM

## 2016-05-27 NOTE — Progress Notes (Signed)
Subjective:  Tara Suarez is a 25 y.o. G2P1001 at 7348w5d being seen today for ongoing prenatal care.  She is currently monitored for the following issues for this low risk pregnancy and has Encounter for supervision of other normal pregnancy in first trimester and Anxiety in pregnancy, antepartum on her problem list.  Patient reports no complaints.  Contractions: Not present. Vag. Bleeding: None.  Movement: Present. Denies leaking of fluid.   The following portions of the patient's history were reviewed and updated as appropriate: allergies, current medications, past family history, past medical history, past social history, past surgical history and problem list. Problem list updated.  Objective:   Filed Vitals:   05/27/16 0801  BP: 115/70  Pulse: 63  Weight: 173 lb 14.4 oz (78.881 kg)    Fetal Status: Fetal Heart Rate (bpm): 154   Movement: Present     General:  Alert, oriented and cooperative. Patient is in no acute distress.  Skin: Skin is warm and dry. No rash noted.   Cardiovascular: Normal heart rate noted  Respiratory: Normal respiratory effort, no problems with respiration noted  Abdomen: Soft, gravid, appropriate for gestational age. Pain/Pressure: Present     Pelvic: Cervical exam deferred        Extremities: Normal range of motion.  Edema: None  Mental Status: Normal mood and affect. Normal behavior. Normal judgment and thought content.   Urinalysis:      Assessment and Plan:  Pregnancy: G2P1001 at 5348w5d  1. Encounter for supervision of other normal pregnancy in first trimester - gbs neg, g/c negative - f/u one week; will plan to schedule iol then if still pregnant, and nst @ 40+3  Term labor symptoms and general obstetric precautions including but not limited to vaginal bleeding, contractions, leaking of fluid and fetal movement were reviewed in detail with the patient. Please refer to After Visit Summary for other counseling recommendations.   Kathrynn RunningNoah Bedford  Michaeljames Milnes, MD

## 2016-06-05 ENCOUNTER — Encounter: Payer: Self-pay | Admitting: Family Medicine

## 2016-06-05 ENCOUNTER — Encounter: Payer: Self-pay | Admitting: Advanced Practice Midwife

## 2016-06-05 ENCOUNTER — Ambulatory Visit (INDEPENDENT_AMBULATORY_CARE_PROVIDER_SITE_OTHER): Payer: BLUE CROSS/BLUE SHIELD | Admitting: Advanced Practice Midwife

## 2016-06-05 ENCOUNTER — Telehealth (HOSPITAL_COMMUNITY): Payer: Self-pay | Admitting: *Deleted

## 2016-06-05 VITALS — BP 126/70 | HR 68 | Wt 175.0 lb

## 2016-06-05 DIAGNOSIS — Z3483 Encounter for supervision of other normal pregnancy, third trimester: Secondary | ICD-10-CM

## 2016-06-05 DIAGNOSIS — Z3481 Encounter for supervision of other normal pregnancy, first trimester: Secondary | ICD-10-CM

## 2016-06-05 LAB — POCT URINALYSIS DIP (DEVICE)
Bilirubin Urine: NEGATIVE
Glucose, UA: NEGATIVE mg/dL
Hgb urine dipstick: NEGATIVE
Ketones, ur: NEGATIVE mg/dL
Nitrite: NEGATIVE
Protein, ur: NEGATIVE mg/dL
Specific Gravity, Urine: 1.02 (ref 1.005–1.030)
Urobilinogen, UA: 0.2 mg/dL (ref 0.0–1.0)
pH: 7 (ref 5.0–8.0)

## 2016-06-05 NOTE — Telephone Encounter (Signed)
Preadmission screen  

## 2016-06-05 NOTE — Progress Notes (Signed)
IOL scheduled for July 19th @ 0630.  Pt notified.

## 2016-06-05 NOTE — Telephone Encounter (Signed)
Preadmission screen Interpreter number [phone]

## 2016-06-05 NOTE — Progress Notes (Signed)
Subjective:  Tara Suarez is a 25 y.o. G2P1001 at 2112w0d being seen today for ongoing prenatal care.  She is currently monitored for the following issues for this low-risk pregnancy and has Encounter for supervision of other normal pregnancy in first trimester and Anxiety in pregnancy, antepartum on her problem list.  Patient reports occasional contractions.  Contractions: Irritability. Vag. Bleeding: None.  Movement: Present. Denies leaking of fluid.   The following portions of the patient's history were reviewed and updated as appropriate: allergies, current medications, past family history, past medical history, past social history, past surgical history and problem list. Problem list updated.  Objective:   Filed Vitals:   06/05/16 0750  BP: 126/70  Pulse: 68  Weight: 175 lb (79.379 kg)    Fetal Status: Fetal Heart Rate (bpm): 139 Fundal Height: 39 cm Movement: Present  Presentation: Vertex  General:  Alert, oriented and cooperative. Patient is in no acute distress.  Skin: Skin is warm and dry. No rash noted.   Cardiovascular: Normal heart rate noted  Respiratory: Normal respiratory effort, no problems with respiration noted  Abdomen: Soft, gravid, appropriate for gestational age. Pain/Pressure: Present     Pelvic:  Cervical exam deferred        Extremities: Normal range of motion.  Edema: Trace  Mental Status: Normal mood and affect. Normal behavior. Normal judgment and thought content.   Urinalysis: Urine Protein: Negative Urine Glucose: Negative  Assessment and Plan:  Pregnancy: G2P1001 at 4112w0d  1. Encounter for supervision of other normal pregnancy in first trimester   Term labor symptoms and general obstetric precautions including but not limited to vaginal bleeding, contractions, leaking of fluid and fetal movement were reviewed in detail with the patient. Please refer to After Visit Summary for other counseling recommendations.  Return in 2 days (on 06/07/2016)  for NST .  IOL at 41 weeks  Dorathy KinsmanVirginia Zayden Hahne, [place]

## 2016-06-05 NOTE — Patient Instructions (Signed)
Fetal Movement Counts  Patient Name: __________________________________________________ Patient Due Date: ____________________  Performing a fetal movement count is highly recommended in high-risk pregnancies, but it is good for every pregnant woman to do. Your health care provider may ask you to start counting fetal movements at 28 weeks of the pregnancy. Fetal movements often increase:  · After eating a full meal.  · After physical activity.  · After eating or drinking something sweet or cold.  · At rest.  Pay attention to when you feel the baby is most active. This will help you notice a pattern of your baby's sleep and wake cycles and what factors contribute to an increase in fetal movement. It is important to perform a fetal movement count at the same time each day when your baby is normally most active.   HOW TO COUNT FETAL MOVEMENTS  1. Find a quiet and comfortable area to sit or lie down on your left side. Lying on your left side provides the best blood and oxygen circulation to your baby.  2. Write down the day and time on a sheet of paper or in a journal.  3. Start counting kicks, flutters, swishes, rolls, or jabs in a 2-hour period. You should feel at least 10 movements within 2 hours.  4. If you do not feel 10 movements in 2 hours, wait 2-3 hours and count again. Look for a change in the pattern or not enough counts in 2 hours.  SEEK MEDICAL CARE IF:  · You feel less than 10 counts in 2 hours, tried twice.  · There is no movement in over an hour.  · The pattern is changing or taking longer each day to reach 10 counts in 2 hours.  · You feel the baby is not moving as he or she usually does.  Date: ____________ Movements: ____________ Start time: ____________ Finish time: ____________   Date: ____________ Movements: ____________ Start time: ____________ Finish time: ____________  Date: ____________ Movements: ____________ Start time: ____________ Finish time: ____________  Date: ____________ Movements:  ____________ Start time: ____________ Finish time: ____________  Date: ____________ Movements: ____________ Start time: ____________ Finish time: ____________  Date: ____________ Movements: ____________ Start time: ____________ Finish time: ____________  Date: ____________ Movements: ____________ Start time: ____________ Finish time: ____________  Date: ____________ Movements: ____________ Start time: ____________ Finish time: ____________   Date: ____________ Movements: ____________ Start time: ____________ Finish time: ____________  Date: ____________ Movements: ____________ Start time: ____________ Finish time: ____________  Date: ____________ Movements: ____________ Start time: ____________ Finish time: ____________  Date: ____________ Movements: ____________ Start time: ____________ Finish time: ____________  Date: ____________ Movements: ____________ Start time: ____________ Finish time: ____________  Date: ____________ Movements: ____________ Start time: ____________ Finish time: ____________  Date: ____________ Movements: ____________ Start time: ____________ Finish time: ____________   Date: ____________ Movements: ____________ Start time: ____________ Finish time: ____________  Date: ____________ Movements: ____________ Start time: ____________ Finish time: ____________  Date: ____________ Movements: ____________ Start time: ____________ Finish time: ____________  Date: ____________ Movements: ____________ Start time: ____________ Finish time: ____________  Date: ____________ Movements: ____________ Start time: ____________ Finish time: ____________  Date: ____________ Movements: ____________ Start time: ____________ Finish time: ____________  Date: ____________ Movements: ____________ Start time: ____________ Finish time: ____________   Date: ____________ Movements: ____________ Start time: ____________ Finish time: ____________  Date: ____________ Movements: ____________ Start time: ____________ Finish  time: ____________  Date: ____________ Movements: ____________ Start time: ____________ Finish time: ____________  Date: ____________ Movements: ____________ Start time:   ____________ Finish time: ____________  Date: ____________ Movements: ____________ Start time: ____________ Finish time: ____________  Date: ____________ Movements: ____________ Start time: ____________ Finish time: ____________  Date: ____________ Movements: ____________ Start time: ____________ Finish time: ____________   Date: ____________ Movements: ____________ Start time: ____________ Finish time: ____________  Date: ____________ Movements: ____________ Start time: ____________ Finish time: ____________  Date: ____________ Movements: ____________ Start time: ____________ Finish time: ____________  Date: ____________ Movements: ____________ Start time: ____________ Finish time: ____________  Date: ____________ Movements: ____________ Start time: ____________ Finish time: ____________  Date: ____________ Movements: ____________ Start time: ____________ Finish time: ____________  Date: ____________ Movements: ____________ Start time: ____________ Finish time: ____________   Date: ____________ Movements: ____________ Start time: ____________ Finish time: ____________  Date: ____________ Movements: ____________ Start time: ____________ Finish time: ____________  Date: ____________ Movements: ____________ Start time: ____________ Finish time: ____________  Date: ____________ Movements: ____________ Start time: ____________ Finish time: ____________  Date: ____________ Movements: ____________ Start time: ____________ Finish time: ____________  Date: ____________ Movements: ____________ Start time: ____________ Finish time: ____________  Date: ____________ Movements: ____________ Start time: ____________ Finish time: ____________   Date: ____________ Movements: ____________ Start time: ____________ Finish time: ____________  Date: ____________  Movements: ____________ Start time: ____________ Finish time: ____________  Date: ____________ Movements: ____________ Start time: ____________ Finish time: ____________  Date: ____________ Movements: ____________ Start time: ____________ Finish time: ____________  Date: ____________ Movements: ____________ Start time: ____________ Finish time: ____________  Date: ____________ Movements: ____________ Start time: ____________ Finish time: ____________  Date: ____________ Movements: ____________ Start time: ____________ Finish time: ____________   Date: ____________ Movements: ____________ Start time: ____________ Finish time: ____________  Date: ____________ Movements: ____________ Start time: ____________ Finish time: ____________  Date: ____________ Movements: ____________ Start time: ____________ Finish time: ____________  Date: ____________ Movements: ____________ Start time: ____________ Finish time: ____________  Date: ____________ Movements: ____________ Start time: ____________ Finish time: ____________  Date: ____________ Movements: ____________ Start time: ____________ Finish time: ____________     This information is not intended to replace advice given to you by your health care provider. Make sure you discuss any questions you have with your health care provider.     Document Released: 12/11/2006 Document Revised: 12/02/2014 Document Reviewed: 09/07/2012  Elsevier Interactive Patient Education ©2016 [employer]

## 2016-06-06 ENCOUNTER — Encounter: Payer: Self-pay | Admitting: General Practice

## 2016-06-07 ENCOUNTER — Ambulatory Visit: Payer: BLUE CROSS/BLUE SHIELD | Admitting: Family Medicine

## 2016-06-07 VITALS — BP 128/72 | HR 77

## 2016-06-07 DIAGNOSIS — O480 Post-term pregnancy: Secondary | ICD-10-CM

## 2016-06-07 NOTE — Progress Notes (Signed)
Sx of labor reviewed and pt instructed to return hospital if they should occur prior to IOL appt on 7/19.  Pt voiced understanding.

## 2016-06-07 NOTE — Progress Notes (Signed)
NST reactive.

## 2016-06-10 ENCOUNTER — Encounter (HOSPITAL_COMMUNITY): Payer: Self-pay | Admitting: *Deleted

## 2016-06-10 ENCOUNTER — Inpatient Hospital Stay (HOSPITAL_COMMUNITY): Payer: BLUE CROSS/BLUE SHIELD | Admitting: Anesthesiology

## 2016-06-10 ENCOUNTER — Inpatient Hospital Stay (HOSPITAL_COMMUNITY)
Admission: AD | Admit: 2016-06-10 | Discharge: 2016-06-12 | DRG: 766 | Disposition: A | Discharge status: Home / Self Care | Payer: BLUE CROSS/BLUE SHIELD | Source: Ambulatory Visit | Attending: Obstetrics and Gynecology | Admitting: Obstetrics and Gynecology

## 2016-06-10 ENCOUNTER — Encounter (HOSPITAL_COMMUNITY)
Admission: AD | Disposition: A | Discharge status: Home / Self Care | Payer: Self-pay | Source: Ambulatory Visit | Attending: Obstetrics and Gynecology

## 2016-06-10 DIAGNOSIS — O4593 Premature separation of placenta, unspecified, third trimester: Secondary | ICD-10-CM | POA: Diagnosis not present

## 2016-06-10 DIAGNOSIS — Z3A40 40 weeks gestation of pregnancy: Secondary | ICD-10-CM

## 2016-06-10 DIAGNOSIS — O4590 Premature separation of placenta, unspecified, unspecified trimester: Secondary | ICD-10-CM

## 2016-06-10 DIAGNOSIS — Z3483 Encounter for supervision of other normal pregnancy, third trimester: Secondary | ICD-10-CM | POA: Diagnosis present

## 2016-06-10 DIAGNOSIS — Z8249 Family history of ischemic heart disease and other diseases of the circulatory system: Secondary | ICD-10-CM

## 2016-06-10 DIAGNOSIS — Z833 Family history of diabetes mellitus: Secondary | ICD-10-CM | POA: Diagnosis not present

## 2016-06-10 DIAGNOSIS — Z3481 Encounter for supervision of other normal pregnancy, first trimester: Secondary | ICD-10-CM

## 2016-06-10 DIAGNOSIS — Z98891 History of uterine scar from previous surgery: Secondary | ICD-10-CM

## 2016-06-10 LAB — CBC
HCT: 36.5 % (ref 36.0–46.0)
Hemoglobin: 12.5 g/dL (ref 12.0–15.0)
MCH: 31 pg (ref 26.0–34.0)
MCHC: 34.2 g/dL (ref 30.0–36.0)
MCV: 90.6 fL (ref 78.0–100.0)
Platelets: 205 10*3/uL (ref 150–400)
RBC: 4.03 MIL/uL (ref 3.87–5.11)
RDW: 13.3 % (ref 11.5–15.5)
WBC: 12.3 10*3/uL — ABNORMAL HIGH (ref 4.0–10.5)

## 2016-06-10 LAB — TYPE AND SCREEN
ABO/RH(D): O POS
Antibody Screen: NEGATIVE

## 2016-06-10 SURGERY — Surgical Case
Anesthesia: General | Site: Abdomen

## 2016-06-10 MED ORDER — DIPHENHYDRAMINE HCL 50 MG/ML IJ SOLN: 12.5000 mg | Freq: Four times a day (QID) | INTRAMUSCULAR | Status: DC | PRN

## 2016-06-10 MED ORDER — SUCCINYLCHOLINE CHLORIDE 200 MG/10ML IV SOSY
PREFILLED_SYRINGE | INTRAVENOUS | Status: DC | PRN
  Administered 2016-06-10: 200 mg via INTRAVENOUS

## 2016-06-10 MED ORDER — OXYTOCIN 40 UNITS IN LACTATED RINGERS INFUSION - SIMPLE MED: 2.5000 [IU]/h | INTRAVENOUS | Status: AC

## 2016-06-10 MED ORDER — PRENATAL MULTIVITAMIN CH
1.0000 | ORAL_TABLET | Freq: Every day | ORAL | Status: DC
  Administered 2016-06-11: 1 via ORAL
  Filled 2016-06-10: qty 1

## 2016-06-10 MED ORDER — ACETAMINOPHEN 325 MG PO TABS: 650.0000 mg | ORAL_TABLET | ORAL | Status: DC | PRN

## 2016-06-10 MED ORDER — LIDOCAINE HCL (PF) 1 % IJ SOLN
30.0000 mL | INTRAMUSCULAR | Status: DC | PRN
  Filled 2016-06-10: qty 30

## 2016-06-10 MED ORDER — DIPHENHYDRAMINE HCL 12.5 MG/5ML PO ELIX: 12.5000 mg | ORAL_SOLUTION | Freq: Four times a day (QID) | ORAL | Status: DC | PRN

## 2016-06-10 MED ORDER — LACTATED RINGERS IV SOLN
INTRAVENOUS | Status: DC
  Administered 2016-06-10: 300 mL via INTRAUTERINE

## 2016-06-10 MED ORDER — HYDROMORPHONE 1 MG/ML IV SOLN
INTRAVENOUS | Status: DC
  Administered 2016-06-10: 20:00:00 via INTRAVENOUS
  Administered 2016-06-11: 0.3 mg via INTRAVENOUS
  Filled 2016-06-10: qty 25

## 2016-06-10 MED ORDER — SIMETHICONE 80 MG PO CHEW
80.0000 mg | CHEWABLE_TABLET | ORAL | Status: DC | PRN
  Administered 2016-06-11 – 2016-06-12 (×2): 80 mg via ORAL
  Filled 2016-06-10 (×2): qty 1

## 2016-06-10 MED ORDER — DIPHENHYDRAMINE HCL 25 MG PO CAPS: 25.0000 mg | ORAL_CAPSULE | Freq: Four times a day (QID) | ORAL | Status: DC | PRN

## 2016-06-10 MED ORDER — LACTATED RINGERS IV SOLN
500.0000 mL | Freq: Once | INTRAVENOUS | Status: AC
  Administered 2016-06-10: 500 mL via INTRAVENOUS

## 2016-06-10 MED ORDER — CEFAZOLIN SODIUM-DEXTROSE 2-3 GM-% IV SOLR
INTRAVENOUS | Status: DC | PRN
  Administered 2016-06-10: 2 g via INTRAVENOUS

## 2016-06-10 MED ORDER — EPHEDRINE 5 MG/ML INJ: 10.0000 mg | INTRAVENOUS | Status: DC | PRN

## 2016-06-10 MED ORDER — DIBUCAINE 1 % RE OINT: 1.0000 "application " | TOPICAL_OINTMENT | RECTAL | Status: DC | PRN

## 2016-06-10 MED ORDER — LACTATED RINGERS IV SOLN
INTRAVENOUS | Status: DC | PRN
  Administered 2016-06-10: 17:00:00 via INTRAVENOUS

## 2016-06-10 MED ORDER — OXYCODONE-ACETAMINOPHEN 5-325 MG PO TABS: 2.0000 | ORAL_TABLET | ORAL | Status: DC | PRN

## 2016-06-10 MED ORDER — MENTHOL 3 MG MT LOZG: 1.0000 | LOZENGE | OROMUCOSAL | Status: DC | PRN

## 2016-06-10 MED ORDER — ONDANSETRON HCL 4 MG/2ML IJ SOLN
4.0000 mg | Freq: Four times a day (QID) | INTRAMUSCULAR | Status: DC | PRN
  Administered 2016-06-10: 4 mg via INTRAVENOUS

## 2016-06-10 MED ORDER — SODIUM CHLORIDE 0.9 % IR SOLN
Status: DC | PRN
  Administered 2016-06-10: 1000 mL

## 2016-06-10 MED ORDER — FENTANYL 2.5 MCG/ML BUPIVACAINE 1/10 % EPIDURAL INFUSION (WH - ANES)
14.0000 mL/h | INTRAMUSCULAR | Status: DC | PRN
Start: 2016-06-10 — End: 2016-06-10

## 2016-06-10 MED ORDER — FENTANYL CITRATE (PF) 100 MCG/2ML IJ SOLN
INTRAMUSCULAR | Status: DC | PRN
  Administered 2016-06-10 (×2): 250 ug via INTRAVENOUS

## 2016-06-10 MED ORDER — OXYTOCIN BOLUS FROM INFUSION: 500.0000 mL | INTRAVENOUS | Status: DC

## 2016-06-10 MED ORDER — TETANUS-DIPHTH-ACELL PERTUSSIS 5-2.5-18.5 LF-MCG/0.5 IM SUSP: 0.5000 mL | Freq: Once | INTRAMUSCULAR | Status: DC

## 2016-06-10 MED ORDER — PHENYLEPHRINE 40 MCG/ML (10ML) SYRINGE FOR IV PUSH (FOR BLOOD PRESSURE SUPPORT): 80.0000 ug | PREFILLED_SYRINGE | INTRAVENOUS | Status: DC | PRN

## 2016-06-10 MED ORDER — FENTANYL CITRATE (PF) 100 MCG/2ML IJ SOLN
50.0000 ug | INTRAMUSCULAR | Status: DC | PRN
  Administered 2016-06-10 (×2): 50 ug via INTRAVENOUS
  Filled 2016-06-10: qty 2

## 2016-06-10 MED ORDER — MIDAZOLAM HCL 5 MG/5ML IJ SOLN
INTRAMUSCULAR | Status: DC | PRN
  Administered 2016-06-10: 2 mg via INTRAVENOUS

## 2016-06-10 MED ORDER — OXYCODONE-ACETAMINOPHEN 5-325 MG PO TABS: 1.0000 | ORAL_TABLET | ORAL | Status: DC | PRN

## 2016-06-10 MED ORDER — METOCLOPRAMIDE HCL 5 MG/ML IJ SOLN
INTRAMUSCULAR | Status: DC | PRN
  Administered 2016-06-10: 10 mg via INTRAVENOUS

## 2016-06-10 MED ORDER — ACETAMINOPHEN 325 MG PO TABS
650.0000 mg | ORAL_TABLET | ORAL | Status: DC | PRN
Start: 2016-06-10 — End: 2016-06-12
  Administered 2016-06-11: 650 mg via ORAL
  Filled 2016-06-10: qty 2

## 2016-06-10 MED ORDER — OXYTOCIN 10 UNIT/ML IJ SOLN
40.0000 [IU] | INTRAMUSCULAR | Status: DC | PRN
  Administered 2016-06-10: 40 [IU] via INTRAVENOUS

## 2016-06-10 MED ORDER — NALOXONE HCL 0.4 MG/ML IJ SOLN: 0.4000 mg | INTRAMUSCULAR | Status: DC | PRN

## 2016-06-10 MED ORDER — LACTATED RINGERS IV SOLN
INTRAVENOUS | Status: DC | PRN
  Administered 2016-06-10 (×2): via INTRAVENOUS

## 2016-06-10 MED ORDER — HYDROMORPHONE HCL 1 MG/ML IJ SOLN
0.2500 mg | INTRAMUSCULAR | Status: DC | PRN
  Administered 2016-06-10 (×2): 0.5 mg via INTRAVENOUS

## 2016-06-10 MED ORDER — SENNOSIDES-DOCUSATE SODIUM 8.6-50 MG PO TABS
2.0000 | ORAL_TABLET | ORAL | Status: DC
  Administered 2016-06-12: 1 via ORAL
  Filled 2016-06-10: qty 2

## 2016-06-10 MED ORDER — SOD CITRATE-CITRIC ACID 500-334 MG/5ML PO SOLN
30.0000 mL | ORAL | Status: DC | PRN
  Administered 2016-06-10: 30 mL via ORAL
  Filled 2016-06-10: qty 15

## 2016-06-10 MED ORDER — WITCH HAZEL-GLYCERIN EX PADS: 1.0000 "application " | MEDICATED_PAD | CUTANEOUS | Status: DC | PRN

## 2016-06-10 MED ORDER — COCONUT OIL OIL: 1.0000 "application " | TOPICAL_OIL | Status: DC | PRN

## 2016-06-10 MED ORDER — LACTATED RINGERS IV SOLN
INTRAVENOUS | Status: DC
  Administered 2016-06-10 – 2016-06-11 (×2): via INTRAVENOUS

## 2016-06-10 MED ORDER — DIPHENHYDRAMINE HCL 50 MG/ML IJ SOLN: 12.5000 mg | INTRAMUSCULAR | Status: DC | PRN

## 2016-06-10 MED ORDER — IBUPROFEN 600 MG PO TABS
600.0000 mg | ORAL_TABLET | Freq: Four times a day (QID) | ORAL | Status: DC
  Administered 2016-06-11 – 2016-06-12 (×6): 600 mg via ORAL
  Filled 2016-06-10 (×6): qty 1

## 2016-06-10 MED ORDER — SODIUM CHLORIDE 0.9% FLUSH: 9.0000 mL | INTRAVENOUS | Status: DC | PRN

## 2016-06-10 MED ORDER — TERBUTALINE SULFATE 1 MG/ML IJ SOLN
INTRAMUSCULAR | Status: AC
  Administered 2016-06-10: 0.25 mg
  Filled 2016-06-10: qty 1

## 2016-06-10 MED ORDER — SCOPOLAMINE 1 MG/3DAYS TD PT72
MEDICATED_PATCH | TRANSDERMAL | Status: DC | PRN
  Administered 2016-06-10: 1 via TRANSDERMAL

## 2016-06-10 MED ORDER — HYDROMORPHONE HCL 1 MG/ML IJ SOLN
INTRAMUSCULAR | Status: AC
  Filled 2016-06-10: qty 1

## 2016-06-10 MED ORDER — PROPOFOL 10 MG/ML IV BOLUS
INTRAVENOUS | Status: DC | PRN
  Administered 2016-06-10: 150 mg via INTRAVENOUS

## 2016-06-10 MED ORDER — OXYTOCIN 40 UNITS IN LACTATED RINGERS INFUSION - SIMPLE MED
2.5000 [IU]/h | INTRAVENOUS | Status: DC
  Filled 2016-06-10: qty 1000

## 2016-06-10 MED ORDER — HYDROMORPHONE HCL 1 MG/ML IJ SOLN
INTRAMUSCULAR | Status: DC | PRN
  Administered 2016-06-10 (×2): 1 mg via INTRAVENOUS

## 2016-06-10 MED ORDER — LACTATED RINGERS IV SOLN: 500.0000 mL | INTRAVENOUS | Status: DC | PRN

## 2016-06-10 MED ORDER — LACTATED RINGERS IV SOLN: INTRAVENOUS | Status: DC

## 2016-06-10 MED ORDER — POLYETHYLENE GLYCOL 3350 17 G PO PACK
17.0000 g | PACK | Freq: Every day | ORAL | Status: DC
  Administered 2016-06-11: 17 g via ORAL
  Filled 2016-06-10 (×2): qty 1

## 2016-06-10 MED ORDER — ZOLPIDEM TARTRATE 5 MG PO TABS: 5.0000 mg | ORAL_TABLET | Freq: Every evening | ORAL | Status: DC | PRN

## 2016-06-10 SURGICAL SUPPLY — 31 items
BENZOIN TINCTURE PRP APPL 2/3 (GAUZE/BANDAGES/DRESSINGS) ×2 IMPLANT
CANISTER SUCT 3000ML PPV (MISCELLANEOUS) ×2 IMPLANT
CHLORAPREP W/TINT 26ML (MISCELLANEOUS) ×2 IMPLANT
DRSG OPSITE POSTOP 4X10 (GAUZE/BANDAGES/DRESSINGS) ×2 IMPLANT
DRSG TELFA 3X8 NADH (GAUZE/BANDAGES/DRESSINGS) IMPLANT
ELECT CAUTERY BLADE 6.4 (BLADE) ×2 IMPLANT
ELECT REM PT RETURN 9FT ADLT (ELECTROSURGICAL) ×2
ELECTRODE REM PT RTRN 9FT ADLT (ELECTROSURGICAL) ×1 IMPLANT
GLOVE BIOGEL PI IND STRL 7.0 (GLOVE) ×2 IMPLANT
GLOVE BIOGEL PI IND STRL 7.5 (GLOVE) ×1 IMPLANT
GLOVE BIOGEL PI INDICATOR 7.0 (GLOVE) ×2
GLOVE BIOGEL PI INDICATOR 7.5 (GLOVE) ×1
GLOVE SURG SS PI 7.0 STRL IVOR (GLOVE) ×2 IMPLANT
GOWN STRL REUS W/ TWL LRG LVL3 (GOWN DISPOSABLE) ×2 IMPLANT
GOWN STRL REUS W/ TWL XL LVL3 (GOWN DISPOSABLE) ×1 IMPLANT
GOWN STRL REUS W/TWL LRG LVL3 (GOWN DISPOSABLE) ×2
GOWN STRL REUS W/TWL XL LVL3 (GOWN DISPOSABLE) ×1
LIQUID BAND (GAUZE/BANDAGES/DRESSINGS) ×2 IMPLANT
NS IRRIG 1000ML POUR BTL (IV SOLUTION) ×2 IMPLANT
PACK C SECTION WH (CUSTOM PROCEDURE TRAY) ×2 IMPLANT
PAD OB MATERNITY 4.3X12.25 (PERSONAL CARE ITEMS) ×2 IMPLANT
PAD PREP 24X48 CUFFED NSTRL (MISCELLANEOUS) ×2 IMPLANT
SPONGE LAP 18X18 X RAY DECT (DISPOSABLE) ×2 IMPLANT
STRIP CLOSURE SKIN 1/2X4 (GAUZE/BANDAGES/DRESSINGS) ×2 IMPLANT
SUT MON AB 4-0 PS1 27 (SUTURE) ×2 IMPLANT
SUT MON AB-0 CT1 36 (SUTURE) ×4 IMPLANT
SUT PLAIN 2 0 (SUTURE) ×1
SUT PLAIN ABS 2-0 CT1 27XMFL (SUTURE) ×1 IMPLANT
SUT VIC AB 0 CT1 36 (SUTURE) ×4 IMPLANT
SUT VIC AB 3-0 CT1 27 (SUTURE) ×1
SUT VIC AB 3-0 CT1 TAPERPNT 27 (SUTURE) ×1 IMPLANT

## 2016-06-10 NOTE — H&P (Signed)
Tara Suarez is a 25 y.o. female presenting for labor and SROM. History OB History    Gravida Para Term Preterm AB TAB SAB Ectopic Multiple Living   2 1 1  0 0 0 0 0 0 1     Past Medical History  Diagnosis Date  . Medical history non-contributory    Past Surgical History  Procedure Laterality Date  . No past surgeries     Family History: family history includes Diabetes in her mother; Heart disease in her mother. Social History:  reports that she has never smoked. She has never used smokeless tobacco. She reports that she does not drink alcohol or use illicit drugs.   Prenatal Transfer Tool  Maternal Diabetes: No Genetic Screening: Normal Maternal Ultrasounds/Referrals: Normal Fetal Ultrasounds or other Referrals:  None Maternal Substance Abuse:  No Significant Maternal Medications:  None Significant Maternal Lab Results:  None Other Comments:  None  Review of Systems  Respiratory: Negative.   Cardiovascular: Negative.   Genitourinary:       +ctx since 0700 +SROM in MAU-thin MSF +FM No VB  Neurological: Negative.     Dilation: 4.5 Effacement (%): 70 Station: -2 Exam by:: Valentina Lucks. Woods, RN Blood pressure 129/85, pulse 73, temperature 98.1 F (36.7 C), temperature source Oral, resp. rate 20, height 5\' 6"  (1.676 m), weight 79.379 kg (175 lb), last menstrual period 08/30/2015. Maternal Exam:  Abdomen: Patient reports no abdominal tenderness.   Fetal Exam Fetal Monitor Review: Mode: ultrasound.   Baseline rate: 120.  Variability: moderate (6-25 bpm).   Pattern: accelerations present and no decelerations.    Fetal State Assessment: Category I - tracings are normal.     Physical Exam  Constitutional: She is oriented to person, place, and time. She appears well-developed and well-nourished.  HENT:  Head: Normocephalic and atraumatic.  Neck: Normal range of motion.  Cardiovascular: Normal rate.   Respiratory: Effort normal.  GI: Soft. She exhibits no  distension. There is no tenderness.  Gravid EFW by [employer] 6-6.5lbs  Genitourinary:  SVE: 4.5/70/-2  Musculoskeletal: Normal range of motion.  Neurological: She is alert and oriented to person, place, and time.  Skin: Skin is warm and dry.  Psychiatric: She has a normal mood and affect.    Prenatal labs: ABO, Rh: --/--/O POS (07/17 1205) Antibody: NEG (07/17 1205) Rubella: 7.54 (12/27 0950) RPR: NON REAC (04/19 1027)  HBsAg: NEGATIVE (12/27 0950)  HIV: NONREACTIVE (04/19 1027)  GBS: Negative (06/21 0000)   Assessment/Plan: 40.[redacted] weeks gestation Active labor GBS neg Admit Anesthesia, anagesia, Nitrous prn Anticipate SVD   Donette LarryMelanie Ahria Slappey, CNM 06/10/2016, 2:49 PM

## 2016-06-10 NOTE — Transfer of Care (Signed)
Immediate Anesthesia Transfer of Care Note  Patient: Tara Suarez  Procedure(s) Performed: Procedure(s): CESAREAN SECTION (N/A)  Patient Location: PACU  Anesthesia Type:General  Level of Consciousness: awake, alert  and oriented  Airway & Oxygen Therapy: Patient Spontanous Breathing and Patient connected to nasal cannula oxygen  Post-op Assessment: Report given to RN and Post -op Vital signs reviewed and stable  Post vital signs: Reviewed and stable  Last Vitals:  Filed Vitals:   06/10/16 1647 06/10/16 1652  BP:  138/71  Pulse:  101  Temp:    Resp: 22 20    Last Pain:  Filed Vitals:   06/10/16 1733  PainSc: 9       Patients Stated Pain Goal: 5 (06/10/16 1149)  Complications: No apparent anesthesia complications

## 2016-06-10 NOTE — Anesthesia Preprocedure Evaluation (Signed)
Anesthesia Evaluation  Patient identified by MRN, date of birth, ID band Patient awake    Reviewed: Allergy & Precautions, H&P , Patient's Chart, lab work & pertinent test results, reviewed documented beta blocker date and time   Airway Mallampati: II  TM Distance: >3 FB Neck ROM: full    Dental no notable dental hx.    Pulmonary    Pulmonary exam normal breath sounds clear to auscultation       Cardiovascular  Rhythm:regular Rate:Normal     Neuro/Psych    GI/Hepatic   Endo/Other    Renal/GU      Musculoskeletal   Abdominal   Peds  Hematology   Anesthesia Other Findings STAT CS for FHR's 80's No med problems per Pt OB says Normal OB Hx   Reproductive/Obstetrics                             Anesthesia Physical Anesthesia Plan  ASA: II and emergent  Anesthesia Plan: General   Post-op Pain Management:    Induction: Intravenous  Airway Management Planned: Oral ETT  Additional Equipment:   Intra-op Plan:   Post-operative Plan: Extubation in OR  Informed Consent: I have reviewed the patients History and Physical, chart, labs and discussed the procedure including the risks, benefits and alternatives for the proposed anesthesia with the patient or authorized representative who has indicated his/her understanding and acceptance.   Dental Advisory Given and Dental advisory given  Plan Discussed with: CRNA and Surgeon  Anesthesia Plan Comments: (  Discussed general anesthesia, including possible nausea, instrumentation of airway, sore throat,pulmonary aspiration, etc. I asked if the were any outstanding questions, or  concerns before we proceeded. )        Anesthesia Quick Evaluation

## 2016-06-10 NOTE — Op Note (Signed)
Operative Note   SURGERY DATE: 06/10/16  PRE-OP DIAGNOSIS:  Intrauterine pregnancy @ 40 5/7 weeks, placental abruption   POST-OP DIAGNOSIS: Same  PROCEDURE: Primary low transverse cesarean section via pfannenstiel skin incision with double layer uterine closure  SURGEON: Yorklyn Bingharlie Pickens, MD  ASSISTANT: Jen MowElizabeth Jelani Vreeland, DO  ANESTHESIA: general  ESTIMATED BLOOD LOSS: 750 mL  DRAINS: 100mL UOP via indwelling foley  TOTAL IV FLUIDS: 2800mL crystalloid  VTE Prophylaxis: SCDs to bilateral lower extremities  ANTIBIOTICS: None due to emergent cesarean  SPECIMENS: Placenta, cord blood gases  COMPLICATIONS: None  INDICATIONS: Fetal intolerance, placental abruption  FINDINGS: No intra-abdominal adhesions were noted. Grossly normal uterus, tubes and ovaries. Thin meconium amniotic fluid, cephalic female infant, weight 6#11.2oz, APGARs 8/9, intact placenta.  PROCEDURE IN DETAIL: The patient was taken to the operating room where an emergent cesarean delivery ensued. She was then prepped and draped in the normal fashion in the dorsal supine position with a leftward tilt.  After a time out was performed, a pfannensteil skin incision was made with the scalpel and carried through to the underlying layer of fascia. The fascia was then incised at the midline and this incision was extended laterally bluntly. The rectus muscles were then separated in the midline and the peritoneum was entered bluntly. The bladder blade was inserted and the vesicouterine peritoneum was identified. The bladder blade was inserted.  A low transverse hysterotomy was made with the scalpel until the endometrial cavity was breached and the amniotic sac ruptured with the Allis clamp, yielding thin meconium amniotic fluid. This incision was extended bluntly and the infant's head, shoulders and body were delivered atraumatically.The cord was clamped x 2 and cut, and the infant was handed to the awaiting pediatricians immediately.  Cord blood gases were collected and sent. The placenta was then gradually expressed from the uterus and then the uterus was exteriorized and cleared of all clots and debris, placenta sent to pathology. The hysterotomy was repaired with a running suture of 1-0 Monocryl. A second imbricating layer of 1-0 Monocryl suture was then placed. A single figure-of-eight suture of 1-0 Monocryl were added to achieve excellent hemostasis.   The uterus and adnexa were then returned to the abdomen, and the hysterotomy and all operative sites were reinspected and excellent hemostasis was noted after irrigation and suction of the abdomen with warm saline.  The peritoneum was closed with a running stitch of 1-0 Monocryl. The fascia was reapproximated with 0 Monocryl in a simple running fashion. The subcutaneous layer was then reapproximated with interrupted sutures of 2-0 plain gut, and the skin was then closed with 4-0 monocryl, in a subcuticular fashion.  The patient  tolerated the procedure well. Sponge, lap, needle, and instrument counts were correct x 2. The patient was transferred to the recovery room awake, alert and breathing independently in stable condition.   Jen MowElizabeth Rohith Fauth, DO  OB [facility]

## 2016-06-10 NOTE — Anesthesia Procedure Notes (Signed)
Procedure Name: Intubation Date/Time: 06/10/2016 5:01 PM Performed by: Renford DillsMULLINS, Othell Jaime L Pre-anesthesia Checklist: Patient identified, Emergency Drugs available, Suction available, Patient being monitored and Timeout performed Patient Re-evaluated:Patient Re-evaluated prior to inductionOxygen Delivery Method: Circle system utilized Preoxygenation: Pre-oxygenation with 100% oxygen Intubation Type: IV induction Laryngoscope Size: Glidescope Grade View: Grade I Tube type: Oral Tube size: 7.0 mm Number of attempts: 1 Airway Equipment and Method: Stylet Placement Confirmation: ETT inserted through vocal cords under direct vision,  positive ETCO2 and breath sounds checked- equal and bilateral (By Cristela BlueKyle Jackson MD) Secured at: 22 cm Tube secured with: Tape Dental Injury: Teeth and Oropharynx as per pre-operative assessment

## 2016-06-10 NOTE — Progress Notes (Signed)
Fabian NovemberM Bhambri CNM notified of ROM-mceonium stained fluid, orders received to admit pt

## 2016-06-10 NOTE — Progress Notes (Signed)
M Bhambi CNM notified of pt's VE, orders received to ambulate and recheck cervix

## 2016-06-10 NOTE — Progress Notes (Signed)
Labor Progress Note Tara Suarez is a 25 y.o. G2P1001 at 3882w5d presented for labor  S:  Uncomfortable with ctx  O:  BP 143/85 mmHg  Pulse 65  Temp(Src) 98.3 F (36.8 C) (Oral)  Resp 18  Ht 5\' 6"  (1.676 m)  Wt 79.379 kg (175 lb)  BMI 28.26 kg/m2  LMP 08/30/2015 (Within Days) EFM: baseline 115 bpm/mod variability/no accels/ + decels  Toco: 1.5-2  SVE: Dilation: 5 Effacement (%): 70 Cervical Position: Posterior Station: -2 Presentation: Vertex Exam by:: Valentina Lucks. Woods, RN   A/P: 25 y.o. G2P1001 3382w5d  40.[redacted] weeks gestation Fetal bradycardia  FHR down to 60-70s x8 min, no improvement with maternal positioning, o2, and Terbutaline. Cervix unchanged, remote from delivery. Dr. Vergie LivingPickens notified, at bedside, consent for stat CS.   Tara Suarez, CNM 5:00 PM

## 2016-06-10 NOTE — Progress Notes (Signed)
Donette LarryMelanie Bhambri CNM notified of pt's VE, BP, orders received

## 2016-06-10 NOTE — MAU Note (Signed)
Pt presents to MAU with complaints of contractions that started yesterday and she noticed some spotting today.

## 2016-06-10 NOTE — Anesthesia Pain Management Evaluation Note (Signed)
  CRNA Pain Management Visit Note  Patient: Tara Suarez, 25 y.o., female  "Hello [facility] am a [facility]. We have an anesthesia team available at all times to provide care throughout the hospital, including epidural management and anesthesia for C-section. I don't know your plan for the delivery whether it a natural birth, water birth, IV sedation, nitrous supplementation, doula or epidural, but we want to meet your pain goals."   1.Was your pain managed to your expectations on prior hospitalizations?   Yes   2.What is your expectation for pain management during this hospitalization?     Epidural  3.How can we help you reach that goal? Epidural.  RN notified that pain control is needed.  Record the patient's initial score and the patient's pain goal.   Pain: 9  Pain Goal: 9 [facility] wants you to be able to say your pain was always managed very well.  Amyla Heffner L 06/10/2016

## 2016-06-11 ENCOUNTER — Encounter (HOSPITAL_COMMUNITY): Payer: Self-pay | Admitting: Obstetrics and Gynecology

## 2016-06-11 LAB — CBC
HCT: 25.6 % — ABNORMAL LOW (ref 36.0–46.0)
Hemoglobin: 8.8 g/dL — ABNORMAL LOW (ref 12.0–15.0)
MCH: 31.4 pg (ref 26.0–34.0)
MCHC: 34.4 g/dL (ref 30.0–36.0)
MCV: 91.4 fL (ref 78.0–100.0)
Platelets: 148 10*3/uL — ABNORMAL LOW (ref 150–400)
RBC: 2.8 MIL/uL — ABNORMAL LOW (ref 3.87–5.11)
RDW: 13.3 % (ref 11.5–15.5)
WBC: 13.8 10*3/uL — ABNORMAL HIGH (ref 4.0–10.5)

## 2016-06-11 LAB — RPR: RPR Ser Ql: NONREACTIVE

## 2016-06-11 MED ORDER — OXYCODONE HCL 5 MG PO TABS: 5.0000 mg | ORAL_TABLET | ORAL | Status: DC | PRN

## 2016-06-11 NOTE — Lactation Note (Signed)
This note was copied from a baby's chart. Lactation Consultation Note Moms 2nd baby, didn't BF her 1st baby. Mom has been very sick since delivery. Mom had a complete abruption. Had General anesthesia, fentenel, versed, and has a dilaudid PCA (isn't using hardly). Mom is exhausted and weak feeling. Baby in CN. Mom is breast/formula. Mom BF baby once in PACU. [facility]. Discussed pumping, supply and demand. Mom stated she would but she was to tired right now to watch set up of pump and wanted to rest. Expressed concern and wished her get well. LC brochure left. Encouraged to call when ready to BF assistance. Mom did allow LC to assess breast, mom was semi fowlers position, has pendulum breast w/short shaft nipple. Hand expression, no colostrum visible. Mom had a lot of fluid and cont. To have IV fluids. Could be part of non-compressible tissue in breast. [facility].  Patient Name: Tara Suarez Today's Date: 06/11/2016 Reason for consult: Initial assessment   Maternal Data Has patient been taught Hand Expression?: Yes Does the patient have breastfeeding experience prior to this delivery?: No  Feeding Feeding Type: Bottle Fed - Formula Nipple Type: Slow - flow  LATCH Score/Interventions       Type of Nipple: Everted at rest and after stimulation (short shaft)  Comfort (Breast/Nipple): Soft / non-tender           Lactation Tools Discussed/Used     Consult Status Consult Status: Follow-up Date: 06/11/16 Follow-up type: In-patient    Charyl DancerCARVER, Haden Suder G 06/11/2016, 3:49 AM

## 2016-06-11 NOTE — Progress Notes (Signed)
CSW acknowledged consult for hx of anxiety.  MOB introduced her room quest as her [age]yr old daughter. MOB was polite and interested in meeting with CSW.  MOB acknowledge her hx of anxiety, and reported she experienced signs and symptoms at [redacted] weeks gestational.  MOB stated she was not prescribed any medications and within 2 weeks the symptoms went away.  CSW educated MOB about PPD and identified signs and symptoms that MOB may experience.  CSW encouraged MOB to seek medical attention if needed for increased signs and symptoms for PPD. CSW reviewed safe sleep, and SIDS. MOB was knowledgeable and asked appropriate questions.  MOB communicated she has a crib for the baby, and feels prepared to take her baby home. CSW provided MOB with information regarding [facility] groups [facility]. CSW thanked MOB for meeting with CSW and MOB did not have any further questions or concerns at this time.

## 2016-06-11 NOTE — Progress Notes (Signed)
Post Partum Day 1  Subjective:  Tara Suarez is a 25 y.o. [id number] 3626w5d s/p PLTCS day #1.  No acute events overnight.  Pt denies problems with ambulating, voiding or po intake.  She denies nausea or vomiting.  Pain is well controlled.  She has had flatus. She has had bowel movement.  Lochia Moderate.  Plan for birth control is Nexplanon.  Method of Feeding: both breast and bottle.  Objective: BP 124/74 mmHg  Pulse 78  Temp(Src) 98.2 F (36.8 C) (Oral)  Resp 14  Ht 5\' 6"  (1.676 m)  Wt 79.379 kg (175 lb)  BMI 28.26 kg/m2  SpO2 98%  LMP 08/30/2015 (Within Days)  Breastfeeding? Unknown  Physical Exam:  General: alert, cooperative and no distress Lochia:normal flow Chest: CTAB Heart: RRR no m/r/g Abdomen: +BS, soft, nontender, fundus firm at/below umbilicus Uterine Fundus: firm, minimally tender to palpation. DVT Evaluation: No evidence of DVT seen on physical exam. Extremities: no edema   Recent Labs  06/10/16 1205 06/11/16 0528  HGB 12.5 8.8*  HCT 36.5 25.6*    Assessment/Plan:  ASSESSMENT: Tara Suarez is a 25 y.o. [id number] 8526w5d a/p PLTCS for abruption doing well.   Used PCA pump once overnight, will switch to oxycodone 5 mg q4 hr for severe pain.  Plan to ambulate with assistance today and increase movements as tolerated.  Plan to discontinue Foley today.   Plan for discharge tomorrow     LOS: 1 day   Clyde Canterburyshley Rubyann Lingle 06/11/2016, 8:50 AM

## 2016-06-11 NOTE — Progress Notes (Signed)
POSTPARTUM PROGRESS NOTE  Post operative Day 1 Subjective:  Tara Suarez is a 25 y.o. [id number] 6731w5d s/p PLTCS for abruption.  No acute events overnight.  Pt denies problems with ambulating, voiding or po intake.  She denies nausea or vomiting.  Pain is well controlled.  She has had flatus. She has not had bowel movement.  Lochia Moderate. Only used PCA 1 time overnight.   Objective: Blood pressure 124/74, pulse 78, temperature 98.2 F (36.8 C), temperature source Oral, resp. rate 14, height 5\' 6"  (1.676 m), weight 175 lb (79.379 kg), last menstrual period 08/30/2015, SpO2 98 %, unknown if currently breastfeeding.  Physical Exam:  General: alert, cooperative and no distress Lochia:normal flow Chest: CTAB Heart: RRR no m/r/g Abdomen: +BS, soft, nontender, incision with minimal blood on honey come on right margin.  Uterine Fundus: firm, minimally tneder DVT Evaluation: No calf swelling or tenderness Extremities: trace edema   Recent Labs  06/10/16 1205 06/11/16 0528  HGB 12.5 8.8*  HCT 36.5 25.6*    Assessment/Plan:  ASSESSMENT: Tara Suarez is a 25 y.o. [id number] 4731w5d s/p PLTCS for abruption. Doing well.  D/C PCA, Start Oxycodone.   Plan for discharge tomorrow and Contraception nexplanon   LOS: 1 day   Ernestina Pennaicholas Alle Difabio 06/11/2016, 7:50 AM

## 2016-06-11 NOTE — Anesthesia Postprocedure Evaluation (Signed)
Anesthesia Post Note  Patient: Tara Suarez  Procedure(s) Performed: Procedure(s) (LRB): CESAREAN SECTION (N/A)  Patient location during evaluation: PACU Anesthesia Type: General Level of consciousness: sedated Pain management: satisfactory to patient Vital Signs Assessment: post-procedure vital signs reviewed and stable Respiratory status: spontaneous breathing Cardiovascular status: stable Anesthetic complications: no     Last Vitals:  Filed Vitals:   06/11/16 0230 06/11/16 0609  BP: 115/80 124/74  Pulse: 80 78  Temp: 36.9 C 36.8 C  Resp: 16 14    Last Pain:  Filed Vitals:   06/11/16 0612  PainSc: 2    Pain Goal: Patients Stated Pain Goal: 2 (06/10/16 2122)               Cristela BlueJACKSON,Rylend Pietrzak EDWARD

## 2016-06-12 ENCOUNTER — Encounter (HOSPITAL_COMMUNITY): Payer: Self-pay | Admitting: Family Medicine

## 2016-06-12 ENCOUNTER — Inpatient Hospital Stay (HOSPITAL_COMMUNITY): Admission: RE | Admit: 2016-06-12 | Payer: BLUE CROSS/BLUE SHIELD | Source: Ambulatory Visit

## 2016-06-12 DIAGNOSIS — Z98891 History of uterine scar from previous surgery: Secondary | ICD-10-CM

## 2016-06-12 DIAGNOSIS — O4590 Premature separation of placenta, unspecified, unspecified trimester: Secondary | ICD-10-CM

## 2016-06-12 MED ORDER — OXYCODONE-ACETAMINOPHEN 5-325 MG PO TABS: 1.0000 | ORAL_TABLET | Freq: Four times a day (QID) | ORAL | Status: DC | PRN

## 2016-06-12 MED ORDER — OXYCODONE HCL 5 MG PO TABS: 5.0000 mg | ORAL_TABLET | ORAL | Status: DC | PRN

## 2016-06-12 NOTE — Discharge Summary (Signed)
OB Discharge Summary     Patient Name: Tara Suarez DOB: 23-Apr-1991 MRN: [id number]  Date of admission: 06/10/2016 Delivering MD: Aletha Halim   Date of discharge: 06/12/2016  Admitting diagnosis: 40WKS,CTX Intrauterine pregnancy: [redacted]w[redacted]d    Secondary diagnosis:  Status primary low transverse cesarean section  Additional problems: placental abruption     Discharge diagnosis: Term Pregnancy Delivered                                                                                                Post partum procedures:none  Augmentation: Pitocin  Complications: None and Placental Abruption  Hospital course:  Onset of Labor With Unplanned C/S  25y.o. yo [id number] 488w5das [place]. Patient had a labor course significant for abruption and fetal bradycardia. Membrane Rupture Time/Date: 11:25 AM ,06/10/2016   The patient went for urgent cesarean section due to placental abruption, and delivered a Viable infant,06/10/2016  Details of operation can be found in separate operative note. Patient had an uncomplicated postpartum course.  She is ambulating,tolerating a regular diet, passing flatus, and urinating well.  Patient is discharged home in stable condition 06/12/2016.  Physical exam  Filed Vitals:   06/11/16 1030 06/11/16 1430 06/11/16 1854 06/12/16 0610  BP: 125/67 118/74 122/70 120/74  Pulse: 98 77 80 78  Temp: 98.6 F (37 C) 98.2 F (36.8 C) 98.8 F (37.1 C) 98.8 F (37.1 C)  TempSrc: Oral Oral Oral Oral  Resp: '16 18 18 18  ' Height:      Weight:      SpO2: 97% 100%     General: alert, cooperative and no distress Lochia: appropriate Uterine Fundus: firm Incision: Healing well with no significant drainage DVT Evaluation: No evidence of DVT seen on physical exam. Labs: Lab Results  Component Value Date   WBC 13.8* 06/11/2016   HGB 8.8* 06/11/2016   HCT 25.6* 06/11/2016   MCV 91.4 06/11/2016   PLT 148* 06/11/2016   CMP  Latest Ref Rng 06/02/2014  Glucose 70 - 99 mg/dL 102(H)  BUN 6 - 23 mg/dL 11  Creatinine 0.50 - 1.10 mg/dL 0.89  Sodium 135 - 145 mEq/L 137  Potassium 3.5 - 5.3 mEq/L 4.1  Chloride 96 - 112 mEq/L 102  CO2 19 - 32 mEq/L 27  Calcium 8.4 - 10.5 mg/dL 9.3  Total Protein 6.0 - 8.3 g/dL 7.4  Total Bilirubin 0.2 - 1.2 mg/dL 0.4  Alkaline Phos 39 - 117 U/L 75  AST 0 - 37 U/L 26  ALT 0 - 35 U/L 37(H)    Discharge instruction: per After Visit Summary and "Baby and Me Booklet".  After visit meds:    Medication List    STOP taking these medications        acetaminophen 325 MG tablet  Commonly known as:  TYLENOL     cyclobenzaprine 10 MG tablet  Commonly known as:  FLEXERIL      TAKE these medications        multivitamin-prenatal 27-0.8 MG Tabs tablet  Take 1 tablet by mouth daily at  12 noon.     oxyCODONE 5 MG immediate release tablet  Commonly known as:  ROXICODONE  Take 1 tablet (5 mg total) by mouth every 4 (four) hours as needed for severe pain.        Diet: routine diet  Activity: Advance as tolerated. Pelvic rest for 6 weeks.   Outpatient follow up:6 weeks Follow up Appt:Future Appointments Date Time Provider [place]  07/18/2016 10:40 AM Truett Mainland, DO [facility]   Follow up Visit:No Follow-up on file.  Postpartum contraception: Condoms  Newborn Data: Live born female  Birth Weight: 6 lb 11.2 oz (3040 g) APGAR: 8, 9  Baby Feeding: Had been formula fed until 41 hr old, met with lactation, was able to get some colostrum with assistance. Educated that "breast is best", is ok with getting lactation assistance Disposition:home with mother   06/12/2016 Casimer Leek, MD pgy-1 MPH   OB FELLOW DISCHARGE ATTESTATION  I have seen and examined this patient and agree with above documentation in the resident's note.   Desma Maxim, MD 3:38 PM

## 2016-06-12 NOTE — Discharge Summary (Deleted)
Obstetric Discharge Summary Reason for Admission: onset of labor Prenatal Procedures: none Intrapartum Procedures: cesarean: low cervical, transverse, urgently 2/2 abruption & fetal bradycardia Postpartum Procedures: none Complications-Operative and Postpartum: none HEMOGLOBIN  Date Value Ref Range Status  06/11/2016 8.8* 12.0 - 15.0 g/dL Final    Comment:    DELTA CHECK NOTED REPEATED TO VERIFY    HCT  Date Value Ref Range Status  06/11/2016 25.6* 36.0 - 46.0 % Final  Pt is not having any shortness of breath, dizziness at this time.  Physical Exam:  General: alert, cooperative and no distress Lochia: appropriate Uterine Fundus: firm Incision: healing well, no significant drainage, no significant erythema DVT Evaluation: No evidence of DVT seen on physical exam. No significant calf/ankle edema.  Discharge Diagnoses: Term Pregnancy-delivered  Discharge Information: Date: 06/12/2016 Activity: unrestricted, as limited by pain Diet: routine Medications: Ibuprofen, Iron, Percocet and and multivitamin Condition: stable Instructions: refer to practice specific booklet Discharge to: home   Newborn Data: Live born female  Birth Weight: 6 lb 11.2 oz (3040 g) APGAR: 8, 9  Home with mother.  Andres Egericia Salimatou Simone, MD PGY1, MPH 06/12/2016, 10:39 AM

## 2016-06-12 NOTE — Lactation Note (Signed)
This note was copied from a [name] chart. Lactation Consultation Note  Patient Name: Tara Suarez UJWJX'BToday'Suarez Date: 06/12/2016 Reason for consult: Follow-up assessment Follow up visit made.  Baby is now 7944 hours old and has been exclusively formula fed.  Mom states he doesn't want it.  She formula fed her first baby 5 years ago because she states no milk.  I asked mom if she would like assist and she is willing.  Breasts are filling.  Mom has good breast tissue.  Mom shown how to hand express colostrum.  Baby latched after a few attempts and nursed well for a few minutes before slipping off.  A 20 mm nipple shield applied for her to have at home in the event she has difficulty latching baby.  Baby latched well and continued to nurse.  Mom is about to be discharged.  Manual pump given with instructions on use, cleaning and EBM storage.  Discussed benefits of exclusive breastfeeding.  Instructed to put baby to breast with any cue.  Outpatient lactation services and support reviewed and encouraged.  Maternal Data    Feeding Feeding Type: Breast Fed  LATCH Score/Interventions Latch: Grasps breast easily, tongue down, lips flanged, rhythmical sucking.  Audible Swallowing: A few with stimulation Intervention(Suarez): Hand expression;Alternate breast massage  Type of Nipple: Everted at rest and after stimulation  Comfort (Breast/Nipple): Soft / non-tender     Hold (Positioning): Assistance needed to correctly position infant at breast and maintain latch. Intervention(Suarez): Breastfeeding basics reviewed;Support Pillows;Position options  LATCH Score: 8  Lactation Tools Discussed/Used Pump Review: Setup, frequency, and cleaning;Milk Storage Initiated by:: LC Date initiated:: 06/12/16   Consult Status Consult Status: Complete    Tara Suarez, Tara Suarez 06/12/2016, 1:14 PM

## 2016-06-12 NOTE — Discharge Summary (Deleted)
Obstetric Discharge Summary Reason for Admission: onset of labor Prenatal Procedures: none Intrapartum Procedures:  Primary low transverse cesarean section performed urgently for abruption & fetal bradycardia Postpartum Procedures: none Complications-Operative and Postpartum: none HEMOGLOBIN  Date Value Ref Range Status  06/11/2016 8.8* 12.0 - 15.0 g/dL Final    Comment:    DELTA CHECK NOTED REPEATED TO VERIFY    HCT  Date Value Ref Range Status  06/11/2016 25.6* 36.0 - 46.0 % Final  Pt reports that her bleeding is minimal at this time and that she is not having any sx of low hgb (no shortness of breath or dizziness)    Physical Exam:  General: alert, cooperative and no distress Lochia: appropriate Uterine Fundus: firm Incision: healing well, no significant drainage, no significant erythema, honeycomb bandage in place DVT Evaluation: No evidence of DVT seen on physical exam. No significant calf/ankle edema.  Discharge Diagnoses: Term Pregnancy-delivered  Discharge Information: Date: 06/12/2016 Activity: limited as pain allows, no lifting heavier than baby Diet: routine Medications: Ibuprofen and percoset for pain as needed, multivitamin, iron, stool softener as needed to ease BMs Condition: stable Instructions: refer to practice specific booklet Discharge to: home   Newborn Data: Live born female  Birth Weight: 6 lb 11.2 oz (3040 g) APGAR: 8, 9  Home with mother.  Andres Egericia Adith Tejada, MD PGY-1, MPH 06/12/2016, 9:16 AM

## 2016-06-12 NOTE — Discharge Instructions (Signed)
Nothing in vagina for 2 weeks. Return to clinic in 2 weeks for incision check and follow up.   Cuidados en el postparto luego de un parto por cesrea  (Postpartum Care After Cesarean Delivery) Despus del parto (perodo de postparto), la estada normal en el hospital es de 24-72 horas. Si hubo problemas con el trabajo de parto o el parto, o si tiene otros problemas mdicos, es posible que Hydrologist en el hospital por ms [place].  Mientras est en el hospital, recibir [place] e instrucciones sobre cmo cuidar de usted misma y de su beb recin nacido durante el postparto.  Mientras est en el hospital:   Es normal que sienta dolor o molestias en la incisin en el abdomen. Asegrese de decirle a las enfermeras si Electronics engineer, as como donde Medical laboratory scientific officer y Training and development officer.  Si est amamantando, puede sentir contracciones dolorosas en el tero durante algunas semanas. Esto es normal. Las contracciones ayudan a que el tero vuelva a su tamao normal.  Es normal tener algo de sangrado despus del [place].  Durante los primeros 1-3 das despus del parto, el flujo es de color rojo y la cantidad puede ser similar a un perodo.  Es frecuente que el flujo se inicie y se Chief Strategy Officer.  En los primeros [place], puede eliminar algunos cogulos pequeos. Informe a las enfermeras si comienza a eliminar cogulos grandes o aumenta el flujo.  No  elimine los cogulos de sangre por el inodoro antes de que la [employer] vea.  Durante los prximos 3 a [address] despus del parto, el flujo debe ser ms acuoso y rosado o Child psychotherapist.  [place] a catorce [employer] del parto, el flujo debe ser una pequea cantidad de secrecin de color blanco amarillento.  La cantidad de flujo disminuir en las primeras semanas despus del parto. El flujo puede detenerse en 6-8 semanas. La mayora de las mujeres no tienen ms flujo a las 12 semanas despus del [place].  Usted debe cambiar sus apsitos con frecuencia.  Lvese  bien las manos con agua y jabn durante al menos 20 segundos despus de cambiar el apsito, usar el bao o antes de Occupational psychologist o Corporate treasurer a su recin nacido.  Se le quitar la va intravenosa (IV) cuando ya est bebiendo suficientes lquidos.  El tubo de drenaje para la orina (catter urinario) que se inserta antes del parto puede ser retirado [employer] de 6-8 horas despus del parto o cuando las piernas vuelvan a Warehouse manager sensibilidad. Usted puede sentir que tiene que vaciar la vejiga durante las primeras 6-8 horas despus de que le quiten el Licensed conveyancer.  Si se siente dbil, mareada o se desmaya, llame a su enfermera antes de levantarse de la cama por primera vez y antes de tomar una ducha por primera vez.  En los primeros [address] despus del parto, podr sentir las mamas sensibles y [place]. Esto se llama congestin. La sensibilidad en los senos por lo general desaparece dentro de las 48-72 horas despus de que ocurre la congestin. Tambin puede notar que la [place] se escapa de sus senos. Si no est amamantando no estimule sus pechos. La estimulacin de las mamas hace que sus senos produzcan ms [place].  Pasar tanto tiempo como sea posible con el beb recin nacido es muy importante. Durante este [place], usted y su beb deben sentirse cerca y conocerse uno al otro. Tener al beb en su habitacin (alojamiento conjunto) ayudar a fortalecer el vnculo con el beb recin nacido.  Esto le dar tiempo para conocerlo y atenderlo de [place] cmoda.  Las hormonas se modifican despus del parto. A veces, los cambios hormonales pueden causar tristeza o ganas de llorar por un tiempo. Estos sentimientos no deben durar ms de Hughes Supplyunos pocos das. Si duran ms que eso, debe hablar con su mdico.  Si lo desea, hable con su mdico acerca de los mtodos de planificacin familiar o mtodos anticonceptivos.  Hable con su mdico acerca de las vacunas. El mdico puede indicarle que se aplique las siguientes vacunas antes de salir del  hospital:  [place] contra el ttanos, la difteria y la tos ferina (Tdap) o el ttanos y la difteria (Td). Es muy importante que usted y su familia (incluyendo a los abuelos) u otras personas que cuidan al recin nacido estn al da con las vacunas Tdap o Td. Las vacunas Tdap o Td pueden ayudar a proteger al recin nacido de enfermedades.  Inmunizacin contra la rubola.  Inmunizacin contra la varicela.  Inmunizacin contra la gripe. Usted debe recibir esta vacunacin anual si no la ha recibido Academic librariandurante el embarazo.   Esta informacin no tiene Theme park managercomo fin reemplazar el consejo del mdico. Asegrese de hacerle al mdico cualquier pregunta que tenga.   Document Released: 10/28/2012 Elsevier Interactive Patient Education [employer]2016 Elsevier Inc.

## 2016-06-13 ENCOUNTER — Encounter (HOSPITAL_COMMUNITY): Payer: Self-pay | Admitting: *Deleted

## 2016-07-18 ENCOUNTER — Encounter: Payer: Self-pay | Admitting: Family Medicine

## 2016-07-18 ENCOUNTER — Ambulatory Visit (INDEPENDENT_AMBULATORY_CARE_PROVIDER_SITE_OTHER): Payer: BLUE CROSS/BLUE SHIELD | Admitting: Family Medicine

## 2016-07-18 LAB — POCT PREGNANCY, URINE: Preg Test, Ur: NEGATIVE

## 2016-07-18 NOTE — Progress Notes (Signed)
Subjective:     Tara Suarez is a 25 y.o. female who presents for a postpartum visit. She is 5 weeks postpartum following a low cervical transverse Cesarean section. I have fully reviewed the prenatal and intrapartum course. The delivery was at 3250w5d gestational weeks. Outcome: primary cesarean section, low transverse incision. Anesthesia: general. Postpartum course has been normal. Baby's course has been normal. Baby is feeding by Similac. Bleeding no bleeding. Bowel function is normal. Bladder function is normal. Patient is not sexually active. Contraception method is none. Postpartum depression screening: negative.  The following portions of the patient's history were reviewed and updated as appropriate: allergies, current medications, past family history, past medical history, past social history, past surgical history and problem list.  Review of Systems Pertinent items are noted in HPI.   Objective:    BP 120/76   Pulse 88   Ht 5\' 3"  (1.6 m)   Wt 159 lb (72.1 kg)   Breastfeeding? No   BMI 28.17 kg/m   General:  alert, cooperative and no distress  Lungs: clear to auscultation bilaterally  Heart:  regular rate and rhythm, S1, S2 normal, no murmur, click, rub or gallop  Abdomen: soft, non-tender; bowel sounds normal; no masses,  no organomegaly.  Incision clean, dry, intact.  Well-healed        Assessment:     Normal postpartum exam. Pap smear not done at today's visit.   Plan:    1. Contraception: none 2. Follow up in: 1 year or as needed.

## 2016-08-23 ENCOUNTER — Ambulatory Visit: Payer: BLUE CROSS/BLUE SHIELD | Admitting: Obstetrics and Gynecology

## 2016-08-26 ENCOUNTER — Telehealth: Payer: Self-pay | Admitting: *Deleted

## 2016-08-26 ENCOUNTER — Ambulatory Visit: Payer: BLUE CROSS/BLUE SHIELD | Admitting: Obstetrics and Gynecology

## 2016-08-26 NOTE — Telephone Encounter (Signed)
Tara Suarez missed a scheduled appointment for a nexplanon. I called her and notified her she missed her scheduled appointment. I asked if she wanted to rescheduled and she said she did not.

## 2016-10-03 ENCOUNTER — Encounter (HOSPITAL_COMMUNITY): Payer: Self-pay | Admitting: Emergency Medicine

## 2016-10-03 ENCOUNTER — Ambulatory Visit (HOSPITAL_COMMUNITY)
Admission: EM | Admit: 2016-10-03 | Discharge: 2016-10-03 | Disposition: A | Discharge status: Home / Self Care | Payer: BLUE CROSS/BLUE SHIELD | Attending: Emergency Medicine | Admitting: Emergency Medicine

## 2016-10-03 DIAGNOSIS — T700XXA Otitic barotrauma, initial encounter: Secondary | ICD-10-CM

## 2016-10-03 DIAGNOSIS — H6983 Other specified disorders of Eustachian tube, bilateral: Secondary | ICD-10-CM | POA: Diagnosis not present

## 2016-10-03 DIAGNOSIS — J301 Allergic rhinitis due to pollen: Secondary | ICD-10-CM

## 2016-10-03 NOTE — ED Provider Notes (Signed)
CSN: [id number]     Arrival date & time 10/03/16  1733 History   First MD Initiated Contact with Patient 10/03/16 1752     Chief Complaint  Patient presents with  . Otalgia   (Consider location/radiation/quality/duration/timing/severity/associated sxs/prior Treatment) 25 year old Hispanic female speaks English presents with a several week history of bilateral years aching or uncomfortable sometimes associated with dizziness and irritation. She also has a feeling of tiredness or itching to the eyes and her head feels heavy. She points to the back of the head as he source of the "heaviness". She notes that she has a job that she has to work for several hours with her head down in one position. Denies head injury.      Past Medical History:  Diagnosis Date  . Medical history non-contributory    Past Surgical History:  Procedure Laterality Date  . CESAREAN SECTION N/A 06/10/2016   Procedure: CESAREAN SECTION;  Surgeon: [place] Bingharlie Pickens, MD;  [facility];  Service: Obstetrics;  Laterality: N/A;  . NO PAST SURGERIES     Family History  Problem Relation Age of Onset  . Diabetes Mother   . Heart disease Mother    Social History  Substance Use Topics  . Smoking status: Never Smoker  . Smokeless tobacco: Never Used  . Alcohol use No   OB History    Gravida Para Term Preterm AB Living   2 2 2  0 0 2   SAB TAB Ectopic Multiple Live Births   0 0 0 0 2     Review of Systems  Constitutional: Negative.  Negative for activity change, appetite change, chills, fatigue and fever.  HENT: Positive for ear pain, postnasal drip and rhinorrhea. Negative for congestion, ear discharge, facial swelling, sinus pain, sore throat and trouble swallowing.   Eyes: Negative.   Respiratory: Negative.   Cardiovascular: Negative.   Musculoskeletal: Negative for neck pain and neck stiffness.  Skin: Negative for pallor and rash.  Neurological: Positive for dizziness.  All other systems  reviewed and are negative.   Allergies  Patient has no known allergies.  Home Medications   Prior to Admission medications   Not on File   Meds Ordered and Administered this Visit  Medications - No data to display  BP 134/75 (BP Location: Right Arm)   Pulse 74   Temp 97.6 F (36.4 C) (Oral)   Resp 18   LMP 09/19/2016   SpO2 100%   Breastfeeding? No  No data found.   Physical Exam  Constitutional: She is oriented to person, place, and time. She appears well-developed and well-nourished. No distress.  HENT:  Head: Normocephalic and atraumatic.  Mouth/Throat: No oropharyngeal exudate.  Bilateral TMs are retracted. No erythema, bulging or effusion. No discoloration.  Oropharynx with mild clear PND. Light cobblestoning.  Eyes:  Minor erythema to the lower lid conjunctivae  Neck: Normal range of motion. Neck supple.  Mild tenderness from the posterior aspect at the angle of the jaw to the anterior neck, along the eustachian tube. Patient notes at this time that opening the jaw produces a pop and discomfort in the ears.  Cardiovascular: Normal rate and regular rhythm.   Pulmonary/Chest: Effort normal and breath sounds normal. No respiratory distress.  Musculoskeletal: Normal range of motion. She exhibits no edema.  Lymphadenopathy:    She has no cervical adenopathy.  Neurological: She is alert and oriented to person, place, and time.  Skin: Skin is warm and dry. No rash noted.  Psychiatric: She has a normal mood and affect.  Nursing note and vitals reviewed.   Urgent Care Course   Clinical Course     Procedures (including critical care time)  Labs Review Labs Reviewed - No data to display  Imaging Review No results found.   Visual Acuity Review  Right Eye Distance:   Left Eye Distance:   Bilateral Distance:    Right Eye Near:   Left Eye Near:    Bilateral Near:         MDM   1. ETD (Eustachian tube dysfunction), bilateral   2. Barotitis media,  initial encounter   3. Acute seasonal allergic rhinitis due to pollen    Sudafed PE 10 mg every 6 hours as needed for congestion. Zyrtec 10 mg a day or Allegra 60 mg twice a day to help with drainage in the back of the throat and neck. Ibuprofen for head or neck pain. Drink plenty of fluids and stay well-hydrated. Heat to the muscles of the back of his neck. Perform stretches to the neck muscles as demonstrated.     Hayden Rasmussenavid Lavaun Greenfield, NP 10/03/16 [phone]

## 2016-10-03 NOTE — Discharge Instructions (Signed)
Sudafed PE 10 mg every 6 hours as needed for congestion. Zyrtec 10 mg a day or Allegra 60 mg twice a day to help with drainage in the back of the throat and neck. Ibuprofen for head or neck pain. Drink plenty of fluids and stay well-hydrated. Heat to the muscles of the back of his neck. Perform stretches to the neck muscles as demonstrated.

## 2016-10-03 NOTE — ED Triage Notes (Signed)
Here for bilateral ear pain onset 1 week associated w/dizziness, bilateral eye irritation  Denies fevers, cold sx  A&O x4... NAD

## 2016-10-24 ENCOUNTER — Ambulatory Visit (INDEPENDENT_AMBULATORY_CARE_PROVIDER_SITE_OTHER): Payer: BLUE CROSS/BLUE SHIELD | Admitting: Family Medicine

## 2016-10-24 VITALS — BP 116/78 | HR 88 | Temp 98.2°F | Resp 17 | Ht 63.0 in | Wt 167.0 lb

## 2016-10-24 DIAGNOSIS — J0100 Acute maxillary sinusitis, unspecified: Secondary | ICD-10-CM | POA: Diagnosis not present

## 2016-10-24 DIAGNOSIS — H6593 Unspecified nonsuppurative otitis media, bilateral: Secondary | ICD-10-CM | POA: Diagnosis not present

## 2016-10-24 MED ORDER — AMOXICILLIN-POT CLAVULANATE 875-125 MG PO TABS: 1.0000 | ORAL_TABLET | Freq: Two times a day (BID) | ORAL | 0 refills | Status: DC

## 2016-10-24 MED ORDER — IPRATROPIUM BROMIDE 0.03 % NA SOLN: 2.0000 | Freq: Two times a day (BID) | NASAL | 0 refills | Status: DC

## 2016-10-24 NOTE — Progress Notes (Signed)
Patient ID: Tara Suarez, female    DOB: 02/11/1991, 25 y.o.   MRN: [id number]  PCP: No PCP Per Patient  Chief Complaint  Patient presents with  . Ear Problem    Bilateral x1week with dizziness     Subjective:   HPI 25 year old female presents for evaluation of ear pain with nasal congestion times 3 weeks. Greater than 3 years since [facility]. Reports over the last two visits being seen at two other clinics for same complaints. Reports illness started as nasal congestion with ear fullness. She was advised of fluid  present in both ears and to take Zyrtec and Sudafed for symptoms. Reports no improvement and worsening of symptoms. persistent dizziness over this week with walking. Bilateral headache with teeth , gum, and eye pain. Subjective fever. Denies N&V.  Social History   Social History  . Marital status: Single    Spouse name: N/A  . Number of children: N/A  . Years of education: N/A   Occupational History  . Not on file.   Social History Main Topics  . Smoking status: Never Smoker  . Smokeless tobacco: Never Used  . Alcohol use No  . Drug use: No  . Sexual activity: Yes    Birth control/ protection: None, Condom   Other Topics Concern  . Not on file   Social History Narrative  . No narrative on file    Family History  Problem Relation Age of Onset  . Diabetes Mother   . Heart disease Mother    Review of Systems See HPI    Patient Active Problem List   Diagnosis Date Noted  . Anxiety in pregnancy, antepartum 02/14/2016     Prior to Admission medications   Medication Sig Start Date End Date Taking? Authorizing Provider  cyclobenzaprine (FLEXERIL) 10 MG tablet Take 10 mg by mouth 3 (three) times daily as needed for muscle spasms.   Yes Historical Provider, MD  naproxen (NAPROSYN) 500 MG tablet Take 500 mg by mouth 2 (two) times daily with a meal.   Yes Historical Provider, MD  No Known Allergies   Objective:  Physical Exam    Constitutional: She is oriented to person, place, and time. She appears well-developed and well-nourished.  HENT:  Head: Normocephalic and atraumatic.  Right Ear: No tenderness. A middle ear effusion is present.  Left Ear: There is swelling. No tenderness. A middle ear effusion is present.  Nose: Mucosal edema and rhinorrhea present.  Mouth/Throat: Oropharynx is clear and moist.  Eyes: Conjunctivae are normal. Pupils are equal, round, and reactive to light.  Neck: Normal range of motion. Neck supple.  Cardiovascular: Normal rate, regular rhythm, normal heart sounds and intact distal pulses.   Pulmonary/Chest: Effort normal and breath sounds normal.  Musculoskeletal: Normal range of motion.  Neurological: She is alert and oriented to person, place, and time.  Skin: Skin is warm and dry.  Psychiatric: She has a normal mood and affect. Her behavior is normal. Judgment and thought content normal.    Vitals:   10/24/16 0800  BP: 116/78  Pulse: 88  Resp: 17  Temp: 98.2 F (36.8 C)   Assessment & Plan:  1. Acute non-recurrent maxillary sinusitis 2. Fluid level behind tympanic membrane of both ears  Plan: . naproxen (NAPROSYN) 500 MG tablet    Sig: Take 500 mg by mouth 2 (two) times daily with a meal.  . ipratropium (ATROVENT) 0.03 % nasal spray  Sig: Place 2 sprays into both nostrils 2 (two) times daily.  Marland Kitchen. amoxicillin-clavulanate (AUGMENTIN) 875-125 MG tablet    Sig: Take 1 tablet by mouth 2 (two) times daily.   Return for follow-up as needed.  Godfrey PickKimberly S. Tiburcio PeaHarris, MSN, FNP-C [facility]

## 2016-10-24 NOTE — Patient Instructions (Addendum)
Return if symptoms worsen or do not improve.  Continue daily Zyrtec 10 mg until pressure in ears relieved.  Atrovent 2 sprays twice daily as needed to relieve nasal congestion.  Augmentin 1 tablet twice daily for sinus infection.  IF you received an x-ray today, you will receive an invoice from [facility]. Please contact [facility] at [phone] with questions or concerns regarding your invoice.   IF you received labwork today, you will receive an invoice from [employer]. Please contact [facility] at [phone] with questions or concerns regarding your invoice.   Our billing staff will not be able to assist you with questions regarding bills from these companies.  You will be contacted with the lab results as soon as they are available. The fastest way to get your results is to activate your My Chart account. Instructions are located on the last page of this paperwork. If you have not heard from [place] regarding the results in 2 weeks, please contact this office.     Sinusitis, Adult Sinusitis is soreness and inflammation of your sinuses. Sinuses are hollow spaces in the bones around your face. Your sinuses are located:  Around your eyes.  In the middle of your forehead.  Behind your nose.  In your cheekbones. Your sinuses and nasal passages are lined with a stringy fluid (mucus). Mucus normally drains out of your sinuses. When your nasal tissues become inflamed or swollen, the mucus can become trapped or blocked so air cannot flow through your sinuses. This allows bacteria, viruses, and funguses to grow, which leads to infection. Sinusitis can develop quickly and last for 7?10 days (acute) or for more than 12 weeks (chronic). Sinusitis often develops after a cold. What are the causes? This condition is caused by anything that creates swelling in the sinuses or stops mucus from draining, including:  Allergies.  Asthma.  Bacterial or  viral infection.  Abnormally shaped bones between the nasal passages.  Nasal growths that contain mucus (nasal polyps).  Narrow sinus openings.  Pollutants, such as chemicals or irritants in the air.  A foreign object stuck in the nose.  A fungal infection. This is rare. What increases the risk? The following factors may make you more likely to develop this condition:  Having allergies or asthma.  Having had a recent cold or respiratory tract infection.  Having structural deformities or blockages in your nose or sinuses.  Having a weak immune system.  Doing a lot of swimming or diving.  Overusing nasal sprays.  Smoking. What are the signs or symptoms? The main symptoms of this condition are pain and a feeling of pressure around the affected sinuses. Other symptoms include:  Upper toothache.  Earache.  Headache.  Bad breath.  Decreased sense of smell and taste.  A cough that may get worse at night.  Fatigue.  Fever.  Thick drainage from your nose. The drainage is often green and it may contain pus (purulent).  Stuffy nose or congestion.  Postnasal drip. This is when extra mucus collects in the throat or back of the nose.  Swelling and warmth over the affected sinuses.  Sore throat.  Sensitivity to light. How is this diagnosed? This condition is diagnosed based on symptoms, a medical history, and a physical exam. To find out if your condition is acute or chronic, your health care provider may:  Look in your nose for signs of nasal polyps.  Tap over the affected sinus to check for signs of infection.  View the inside of your sinuses using an imaging device that has a light attached (endoscope). If your health care provider suspects that you have chronic sinusitis, you may also:  Be tested for allergies.  Have a sample of mucus taken from your nose (nasal culture) and checked for bacteria.  Have a mucus sample examined to see if your sinusitis is  related to an allergy. If your sinusitis does not respond to treatment and it lasts longer than 8 weeks, you may have an MRI or CT scan to check your sinuses. These scans also help to determine how severe your infection is. In rare cases, a bone biopsy may be done to rule out more serious types of fungal sinus disease. How is this treated? Treatment for sinusitis depends on the cause and whether your condition is chronic or acute. If a virus is causing your sinusitis, your symptoms will go away on their own within 10 days. You may be given medicines to relieve your symptoms, including:  Topical nasal decongestants. They shrink swollen nasal passages and let mucus drain from your sinuses.  Antihistamines. These drugs block inflammation that is triggered by allergies. This can help to ease swelling in your nose and sinuses.  Topical nasal corticosteroids. These are nasal sprays that ease inflammation and swelling in your nose and sinuses.  Nasal saline washes. These rinses can help to get rid of thick mucus in your nose. If your condition is caused by bacteria, you will be given an antibiotic medicine. If your condition is caused by a fungus, you will be given an antifungal medicine. Surgery may be needed to correct underlying conditions, such as narrow nasal passages. Surgery may also be needed to remove polyps. Follow these instructions at home: Medicines  Take, use, or apply over-the-counter and prescription medicines only as told by your health care provider. These may include nasal sprays.  If you were prescribed an antibiotic medicine, take it as told by your health care provider. Do not stop taking the antibiotic even if you start to feel better. Hydrate and Humidify  Drink enough water to keep your urine clear or pale yellow. Staying hydrated will help to thin your mucus.  Use a cool mist humidifier to keep the humidity level in your home above 50%.  Inhale steam for 10-15 minutes,  3-4 times a day or as told by your health care provider. You can do this in the bathroom while a hot shower is running.  Limit your exposure to cool or dry air. Rest  Rest as much as possible.  Sleep with your head raised (elevated).  Make sure to get enough sleep each night. General instructions  Apply a warm, moist washcloth to your face 3-4 times a day or as told by your health care provider. This will help with discomfort.  Wash your hands often with soap and water to reduce your exposure to viruses and other germs. If soap and water are not available, use hand sanitizer.  Do not smoke. Avoid being around people who are smoking (secondhand smoke).  Keep all follow-up visits as told by your health care provider. This is important. Contact a health care provider if:  You have a fever.  Your symptoms get worse.  Your symptoms do not improve within 10 days. Get help right away if:  You have a severe headache.  You have persistent vomiting.  You have pain or swelling around your face or eyes.  You have vision problems.  You develop  confusion.  Your neck is stiff.  You have trouble breathing. This information is not intended to replace advice given to you by your health care provider. Make sure you discuss any questions you have with your health care provider. Document Released: 11/11/2005 Document Revised: 07/07/2016 Document Reviewed: 09/06/2015 [employer] Interactive Patient Education  2017 [employer]

## 2016-12-16 ENCOUNTER — Ambulatory Visit (INDEPENDENT_AMBULATORY_CARE_PROVIDER_SITE_OTHER): Payer: BLUE CROSS/BLUE SHIELD | Admitting: Family Medicine

## 2016-12-16 ENCOUNTER — Encounter: Payer: Self-pay | Admitting: Family Medicine

## 2016-12-16 VITALS — BP 125/80 | HR 90 | Temp 98.6°F | Ht 63.0 in | Wt 170.4 lb

## 2016-12-16 DIAGNOSIS — M62838 Other muscle spasm: Secondary | ICD-10-CM | POA: Diagnosis not present

## 2016-12-16 DIAGNOSIS — H6991 Unspecified Eustachian tube disorder, right ear: Secondary | ICD-10-CM | POA: Diagnosis not present

## 2016-12-16 DIAGNOSIS — T700XXD Otitic barotrauma, subsequent encounter: Secondary | ICD-10-CM

## 2016-12-16 DIAGNOSIS — R42 Dizziness and giddiness: Secondary | ICD-10-CM | POA: Diagnosis not present

## 2016-12-16 MED ORDER — CYCLOBENZAPRINE HCL 10 MG PO TABS: 10.0000 mg | ORAL_TABLET | Freq: Every day | ORAL | 0 refills | Status: DC

## 2016-12-16 MED ORDER — PREDNISONE 20 MG PO TABS: ORAL_TABLET | ORAL | 0 refills | Status: DC

## 2016-12-16 MED ORDER — PSEUDOEPHEDRINE HCL ER 120 MG PO TB12: 120.0000 mg | ORAL_TABLET | Freq: Two times a day (BID) | ORAL | 0 refills | Status: DC

## 2016-12-16 MED ORDER — CETIRIZINE HCL 10 MG PO TABS: 10.0000 mg | ORAL_TABLET | Freq: Every day | ORAL | 1 refills | Status: DC

## 2016-12-16 MED ORDER — FLUTICASONE PROPIONATE 50 MCG/ACT NA SUSP: 2.0000 | Freq: Every day | NASAL | 2 refills | Status: DC

## 2016-12-16 MED ORDER — MECLIZINE HCL 25 MG PO TABS: 25.0000 mg | ORAL_TABLET | Freq: Three times a day (TID) | ORAL | 0 refills | Status: DC | PRN

## 2016-12-16 NOTE — Patient Instructions (Addendum)
If your symptoms are not resolved within 2 weeks, please call for referral to ear nose and throat.   IF you received an x-ray today, you will receive an invoice from [facility]. Please contact [facility] at [phone] with questions or concerns regarding your invoice.   IF you received labwork today, you will receive an invoice from [facility]. Please contact [facility] at [phone] with questions or concerns regarding your invoice.   Our billing staff will not be able to assist you with questions regarding bills from these companies.  You will be contacted with the lab results as soon as they are available. The fastest way to get your results is to activate your My Chart account. Instructions are located on the last page of this paperwork. If you have not heard from [place] regarding the results in 2 weeks, please contact this office.      Eustachian Tube Dysfunction Introduction The eustachian tube connects the middle ear to the back of the nose. It regulates air pressure in the middle ear by allowing air to move between the ear and nose. It also helps to drain fluid from the middle ear space. When the eustachian tube does not function properly, air pressure, fluid, or both can build up in the middle ear. Eustachian tube dysfunction can affect one or both ears. What are the causes? This condition happens when the eustachian tube becomes blocked or cannot open normally. This may result from:  Ear infections.  Colds and other upper respiratory infections.  Allergies.  Irritation, such as from cigarette smoke or acid from the stomach coming up into the esophagus (gastroesophageal reflux).  Sudden changes in air pressure, such as from descending in an airplane.  Abnormal growths in the nose or throat, such as nasal polyps, tumors, or enlarged tissue at the back of the throat (adenoids). What increases the risk? This condition may be more likely to develop in people  who smoke and people who are overweight. Eustachian tube dysfunction may also be more likely to develop in children, especially children who have:  Certain birth defects of the mouth, such as cleft palate.  Large tonsils and adenoids. What are the signs or symptoms? Symptoms of this condition may include:  A feeling of fullness in the ear.  Ear pain.  Clicking or popping noises in the ear.  Ringing in the ear.  Hearing loss.  Loss of balance. Symptoms may get worse when the air pressure around you changes, such as when you travel to an area of high elevation or fly on an airplane. How is this diagnosed? This condition may be diagnosed based on:  Your symptoms.  A physical exam of your ear, nose, and throat.  Tests, such as those that measure:  The movement of your eardrum (tympanogram).  Your hearing (audiometry). How is this treated? Treatment depends on the cause and severity of your condition. If your symptoms are mild, you may be able to relieve your symptoms by moving air into ("popping") your ears. If you have symptoms of fluid in your ears, treatment may include:  Decongestants.  Antihistamines.  Nasal sprays or ear drops that contain medicines that reduce swelling (steroids). In some cases, you may need to have a procedure to drain the fluid in your eardrum (myringotomy). In this procedure, a small tube is placed in the eardrum to:  Drain the fluid.  Restore the air in the middle ear space. Follow these instructions at home:  Take over-the-counter and prescription medicines only  as told by your health care provider.  Use techniques to help pop your ears as recommended by your health care provider. These may include:  Chewing gum.  Yawning.  Frequent, forceful swallowing.  Closing your mouth, holding your nose closed, and gently blowing as if you are trying to blow air out of your nose.  Do not do any of the following until your health care provider  approves:  Travel to high altitudes.  Fly in airplanes.  Work in a Estate agent or room.  Scuba dive.  Keep your ears dry. Dry your ears completely after showering or bathing.  Do not smoke.  Keep all follow-up visits as told by your health care provider. This is important. Contact a health care provider if:  Your symptoms do not go away after treatment.  Your symptoms come back after treatment.  You are unable to pop your ears.  You have:  A fever.  Pain in your ear.  Pain in your head or neck.  Fluid draining from your ear.  Your hearing suddenly changes.  You become very dizzy.  You lose your balance. This information is not intended to replace advice given to you by your health care provider. Make sure you discuss any questions you have with your health care provider. Document Released: 12/08/2015 Document Revised: 04/18/2016 Document Reviewed: 11/30/2014  2017 Elsevier  Barotitis Media Barotitis media is inflammation of your middle ear. This occurs when the auditory tube (eustachian tube) leading from the back of your nose (nasopharynx) to your eardrum is blocked. This blockage may result from a cold, environmental allergies, or an upper respiratory infection. Unresolved barotitis media may lead to damage or hearing loss (barotrauma), which may become permanent. HOME CARE INSTRUCTIONS   Use medicines as recommended by your health care provider. Over-the-counter medicines will help unblock the canal and can help during times of air travel.  Do not put anything into your ears to clean or unplug them. Eardrops will not be helpful.  Do not swim, dive, or fly until your health care provider says it is all right to do so. If these activities are necessary, chewing gum with frequent, forceful swallowing may help. It is also helpful to hold your nose and gently blow to pop your ears for equalizing pressure changes. This forces air into the eustachian tube.  Only  take over-the-counter or prescription medicines for pain, discomfort, or fever as directed by your health care provider.  A decongestant may be helpful in decongesting the middle ear and make pressure equalization easier. SEEK MEDICAL CARE IF:  You experience a serious form of dizziness in which you feel as if the room is spinning and you feel nauseated (vertigo).  Your symptoms only involve one ear. SEEK IMMEDIATE MEDICAL CARE IF:   You develop a severe headache, dizziness, or severe ear pain.  You have bloody or pus-like drainage from your ears.  You develop a fever.  Your problems do not improve or become worse. MAKE SURE YOU:   Understand these instructions.  Will watch your condition.  Will get help right away if you are not doing well or get worse. This information is not intended to replace advice given to you by your health care provider. Make sure you discuss any questions you have with your health care provider. Document Released: 11/08/2000 Document Revised: 09/01/2013 Document Reviewed: 06/08/2013 Elsevier Interactive Patient Education  2017 Elsevier Inc.  Vertigo Vertigo is the feeling that you or your surroundings are moving when  they are not. Vertigo can be dangerous if it occurs while you are doing something that could endanger you or others, such as driving. What are the causes? This condition is caused by a disturbance in the signals that are sent by your body's sensory systems to your brain. Different causes of a disturbance can lead to vertigo, including:  Infections, especially in the inner ear.  A bad reaction to a drug, or misuse of alcohol and medicines.  Withdrawal from drugs or alcohol.  Quickly changing positions, as when lying down or rolling over in bed.  Migraine headaches.  Decreased blood flow to the brain.  Decreased blood pressure.  Increased pressure in the brain from a head or neck injury, stroke, infection, tumor, or  bleeding.  Central nervous system disorders. What are the signs or symptoms? Symptoms of this condition usually occur when you move your head or your eyes in different directions. Symptoms may start suddenly, and they usually last for less than a minute. Symptoms may include:  Loss of balance and falling.  Feeling like you are spinning or moving.  Feeling like your surroundings are spinning or moving.  Nausea and vomiting.  Blurred vision or double vision.  Difficulty hearing.  Slurred speech.  Dizziness.  Involuntary eye movement (nystagmus). Symptoms can be mild and cause only slight annoyance, or they can be severe and interfere with daily life. Episodes of vertigo may return (recur) over time, and they are often triggered by certain movements. Symptoms may improve over time. How is this diagnosed? This condition may be diagnosed based on medical history and the quality of your nystagmus. Your health care provider may test your eye movements by asking you to quickly change positions to trigger the nystagmus. This may be called the Dix-Hallpike test, head thrust test, or roll test. You may be referred to a health care provider who specializes in ear, nose, and throat (ENT) problems (otolaryngologist) or a provider who specializes in disorders of the central nervous system (neurologist). You may have additional testing, including:  A physical exam.  Blood tests.  MRI.  A CT scan.  An electrocardiogram (ECG). This records electrical activity in your heart.  An electroencephalogram (EEG). This records electrical activity in your brain.  Hearing tests. How is this treated? Treatment for this condition depends on the cause and the severity of the symptoms. Treatment options include:  Medicines to treat nausea or vertigo. These are usually used for severe cases. Some medicines that are used to treat other conditions may also reduce or eliminate vertigo symptoms. These  include:  Medicines that control allergies (antihistamines).  Medicines that control seizures (anticonvulsants).  Medicines that relieve depression (antidepressants).  Medicines that relieve anxiety (sedatives).  Head movements to adjust your inner ear back to normal. If your vertigo is caused by an ear problem, your health care provider may recommend certain movements to correct the problem.  Surgery. This is rare. Follow these instructions at home: Safety  Move slowly.Avoid sudden body or head movements.  Avoid driving.  Avoid operating heavy machinery.  Avoid doing any tasks that would cause danger to you or others if you would have a vertigo episode during the task.  If you have trouble walking or keeping your balance, try using a cane for stability. If you feel dizzy or unstable, sit down right away.  Return to your normal activities as told by your health care provider. Ask your health care provider what activities are safe for you. General instructions  Take over-the-counter and prescription medicines only as told by your health care provider.  Avoid certain positions or movements as told by your health care provider.  Drink enough fluid to keep your urine clear or pale yellow.  Keep all follow-up visits as told by your health care provider. This is important. Contact a health care provider if:  Your medicines do not relieve your vertigo or they make it worse.  You have a fever.  Your condition gets worse or you develop new symptoms.  Your family or friends notice any behavioral changes.  Your nausea or vomiting gets worse.  You have numbness or a "pins and needles" sensation in part of your body. Get help right away if:  You have difficulty moving or speaking.  You are always dizzy.  You faint.  You develop severe headaches.  You have weakness in your hands, arms, or legs.  You have changes in your hearing or vision.  You develop a stiff  neck.  You develop sensitivity to light. This information is not intended to replace advice given to you by your health care provider. Make sure you discuss any questions you have with your health care provider. Document Released: 08/21/2005 Document Revised: 04/24/2016 Document Reviewed: 03/06/2015 Elsevier Interactive Patient Education  2017 [employer]

## 2016-12-16 NOTE — Progress Notes (Signed)
Subjective:    Patient ID: Tara Suarez, female    DOB: 1991/09/23, 26 y.o.   MRN: [id number] Chief Complaint  Patient presents with  . Ear Fullness    X 3 weeks off balance  . Neck Pain    X 4 days    HPI  Tara Suarez is a 26 yo woman who has been having about 3 months of problems with ear pain and dizziness. She was initially seen at [facility] 11/9 and diagnosed with ETD. She was started on sudafed PE 10mg  q6hrs with prn ibuprofen. She has been drinking lots of water and apply heat to her neck and stretching without benefit.  Therefore about 2 mos ago she was seen at our office by a colleague and pt was treated with Augmentin and atrovent nasal spray. She reports she did get some better after the Augmentin but never all the way and now worsening again with vague discomfort in the left ear. No drainage, no f/c, no sinus pain, no dental prob  She takes zyrtec every morning and has not seen any benefit.   Agrees to muscle relaxer but we see she has been put on flexeril 10 tid and naproxen bid from elsewhere and pt reports she really did not see much benefit with this.  Past Medical History:  Diagnosis Date  . Medical history non-contributory    Past Surgical History:  Procedure Laterality Date  . CESAREAN SECTION N/A 06/10/2016   Procedure: CESAREAN SECTION;  Surgeon: Corning Bing, MD;  Location: [facility] BIRTHING SUITES;  Service: Obstetrics;  Laterality: N/A;  . NO PAST SURGERIES     Current Outpatient Prescriptions on File Prior to Visit  Medication Sig Dispense Refill  . amoxicillin-clavulanate (AUGMENTIN) 875-125 MG tablet Take 1 tablet by mouth 2 (two) times daily. (Patient not taking: Reported on 12/16/2016) 20 tablet 0  . cyclobenzaprine (FLEXERIL) 10 MG tablet Take 10 mg by mouth 3 (three) times daily as needed for muscle spasms.    Marland Kitchen ipratropium (ATROVENT) 0.03 % nasal spray Place 2 sprays into both nostrils 2 (two) times daily. (Patient not taking: Reported on 12/16/2016) 30 mL 0    . naproxen (NAPROSYN) 500 MG tablet Take 500 mg by mouth 2 (two) times daily with a meal.     No current facility-administered medications on file prior to visit.    No Known Allergies Family History  Problem Relation Age of Onset  . Diabetes Mother   . Heart disease Mother    Social History   Social History  . Marital status: Single    Spouse name: N/A  . Number of children: N/A  . Years of education: N/A   Social History Main Topics  . Smoking status: Never Smoker  . Smokeless tobacco: Never Used  . Alcohol use No  . Drug use: No  . Sexual activity: Yes    Birth control/ protection: None, Condom   Other Topics Concern  . None   Social History Narrative  . None   Depression screen [facility] 2/9 12/16/2016 10/24/2016  Decreased Interest 0 0  Down, Depressed, Hopeless 0 0  PHQ - 2 Score 0 0    Review of Systems  Constitutional: Positive for fatigue. Negative for activity change, appetite change, chills, diaphoresis and fever.  HENT: Positive for ear pain and sinus pressure. Negative for congestion, dental problem, ear discharge, facial swelling, hearing loss, nosebleeds, postnasal drip, rhinorrhea, sinus pain, sore throat, trouble swallowing and voice change.   Eyes: Negative for  discharge and itching.  Respiratory: Negative for cough and shortness of breath.   Cardiovascular: Negative for chest pain.  Gastrointestinal: Negative for abdominal pain, nausea and vomiting.  Musculoskeletal: Positive for back pain, myalgias, neck pain and neck stiffness. Negative for arthralgias, gait problem and joint swelling.  Skin: Negative for rash.  Neurological: Positive for light-headedness and headaches. Negative for dizziness and syncope.  Hematological: Negative for adenopathy.  Psychiatric/Behavioral: Negative for dysphoric mood. The patient is not nervous/anxious.        Objective:   Physical Exam  Constitutional: She is oriented to person, place, and time. She appears  well-developed and well-nourished. No distress.  HENT:  Head: Normocephalic and atraumatic.  Right Ear: External ear and ear canal normal. Tympanic membrane is retracted. A middle ear effusion is present.  Left Ear: Tympanic membrane, external ear and ear canal normal.  Nose: Nose normal. No mucosal edema or rhinorrhea.  Mouth/Throat: Uvula is midline, oropharynx is clear and moist and mucous membranes are normal. No oropharyngeal exudate.  Eyes: Conjunctivae are normal. Right eye exhibits no discharge. Left eye exhibits no discharge. No scleral icterus.  Neck: Normal range of motion. Neck supple.  Cardiovascular: Normal rate, regular rhythm, normal heart sounds and intact distal pulses.   Pulmonary/Chest: Effort normal and breath sounds normal.  Musculoskeletal:       Cervical back: She exhibits pain and spasm. She exhibits normal range of motion, no tenderness, no bony tenderness, no edema, no deformity and normal pulse.  Lymphadenopathy:    She has no cervical adenopathy.  Neurological: She is alert and oriented to person, place, and time.  Skin: Skin is warm and dry. She is not diaphoretic. No erythema.  Psychiatric: She has a normal mood and affect. Her behavior is normal.   Neg dix-hallpike B.  BP 125/80 (BP Location: Right Arm, Patient Position: Sitting, Cuff Size: Small)   Pulse 90   Temp 98.6 F (37 C) (Oral)   Ht 5\' 3"  (1.6 m)   Wt 170 lb 6.4 oz (77.3 kg)   LMP 11/11/2016 (Approximate)   SpO2 100%   BMI 30.19 kg/m      Assessment & Plan:  Call for referral to ENT if sxs persist in 2 wks.  1. Eustachian tube disorder, right   2. Dizziness and giddiness   3. Muscle spasms of neck   4. Barotitis media, subsequent encounter     Orders Placed This Encounter  Procedures  . CBC with Differential/Platelet  . TSH  . Comprehensive metabolic panel  . Sedimentation Rate  . Ambulatory referral to ENT    Referral Priority:   Routine    Referral Type:   Consultation     Referral Reason:   Specialty Services Required    Requested Specialty:   Otolaryngology    Number of Visits Requested:   1  . Care order/instruction:    Scheduling Instructions:     Complete orders, AVS and go.    Meds ordered this encounter  Medications  . predniSONE (DELTASONE) 20 MG tablet    Sig: Take 3 tabs qd x 3d, then 2 tabs qd x 3d then 1 tab qd x 3d.    Dispense:  18 tablet    Refill:  0  . pseudoephedrine (SUDAFED 12 HOUR) 120 MG 12 hr tablet    Sig: Take 1 tablet (120 mg total) by mouth 2 (two) times daily.    Dispense:  30 tablet    Refill:  0  . cyclobenzaprine (  FLEXERIL) 10 MG tablet    Sig: Take 1 tablet (10 mg total) by mouth at bedtime. For neck spasm    Dispense:  30 tablet    Refill:  0  . meclizine (ANTIVERT) 25 MG tablet    Sig: Take 1 tablet (25 mg total) by mouth 3 (three) times daily as needed for dizziness.    Dispense:  30 tablet    Refill:  0  . cetirizine (ZYRTEC) 10 MG tablet    Sig: Take 1 tablet (10 mg total) by mouth at bedtime.    Dispense:  30 tablet    Refill:  1  . fluticasone (FLONASE) 50 MCG/ACT nasal spray    Sig: Place 2 sprays into both nostrils at bedtime.    Dispense:  16 g    Refill:  2    Norberto Sorenson, M.D.  Primary Care at [facility] [address] [place], [place] [address] [phone] phone [phone] fax  01/10/17 7:37 PM

## 2016-12-17 LAB — CBC WITH DIFFERENTIAL/PLATELET
Basophils Absolute: 0 10*3/uL (ref 0.0–0.2)
Basos: 0 %
EOS (ABSOLUTE): 0.1 10*3/uL (ref 0.0–0.4)
Eos: 1 %
Hematocrit: 40.3 % (ref 34.0–46.6)
Hemoglobin: 13.6 g/dL (ref 11.1–15.9)
Immature Grans (Abs): 0 10*3/uL (ref 0.0–0.1)
Immature Granulocytes: 0 %
Lymphocytes Absolute: 3.1 10*3/uL (ref 0.7–3.1)
Lymphs: 29 %
MCH: 28.9 pg (ref 26.6–33.0)
MCHC: 33.7 g/dL (ref 31.5–35.7)
MCV: 86 fL (ref 79–97)
Monocytes Absolute: 0.6 10*3/uL (ref 0.1–0.9)
Monocytes: 6 %
Neutrophils Absolute: 6.7 10*3/uL (ref 1.4–7.0)
Neutrophils: 64 %
Platelets: 268 10*3/uL (ref 150–379)
RBC: 4.7 x10E6/uL (ref 3.77–5.28)
RDW: 15.5 % — ABNORMAL HIGH (ref 12.3–15.4)
WBC: 10.5 10*3/uL (ref 3.4–10.8)

## 2016-12-17 LAB — COMPREHENSIVE METABOLIC PANEL
ALT: 49 IU/L — ABNORMAL HIGH (ref 0–32)
AST: 36 IU/L (ref 0–40)
Albumin/Globulin Ratio: 1.4 (ref 1.2–2.2)
Albumin: 4.5 g/dL (ref 3.5–5.5)
Alkaline Phosphatase: 82 IU/L (ref 39–117)
BUN/Creatinine Ratio: 14 (ref 9–23)
BUN: 12 mg/dL (ref 6–20)
Bilirubin Total: 0.3 mg/dL (ref 0.0–1.2)
CO2: 22 mmol/L (ref 18–29)
Calcium: 9.5 mg/dL (ref 8.7–10.2)
Chloride: 99 mmol/L (ref 96–106)
Creatinine, Ser: 0.84 mg/dL (ref 0.57–1.00)
GFR calc Af Amer: 112 mL/min/{1.73_m2} (ref >59)
GFR calc non Af Amer: 97 mL/min/{1.73_m2} (ref >59)
Globulin, Total: 3.3 g/dL (ref 1.5–4.5)
Glucose: 94 mg/dL (ref 65–99)
Potassium: 4.3 mmol/L (ref 3.5–5.2)
Sodium: 140 mmol/L (ref 134–144)
Total Protein: 7.8 g/dL (ref 6.0–8.5)

## 2016-12-17 LAB — SEDIMENTATION RATE: Sed Rate: 22 mm/hr (ref 0–32)

## 2016-12-17 LAB — TSH: TSH: 1.83 u[IU]/mL (ref 0.450–4.500)

## 2016-12-23 ENCOUNTER — Telehealth: Payer: Self-pay

## 2016-12-23 DIAGNOSIS — H6980 Other specified disorders of Eustachian tube, unspecified ear: Secondary | ICD-10-CM

## 2016-12-23 NOTE — Telephone Encounter (Signed)
PATIENT WAS IN LAST Monday TO SEE DR. SHAW FOR FLUID IN HER (R) EAR. DR. Clelia CroftSHAW TOLD HER TO CALL BACK IF THE MEDICATION SHE PRESCRIBED HER DID NOT WORK AND THAT SHE WOULD REFER HER TO AN ENT SPECIALIST. SHE SAID NONE OF THE MEDICINE WORKED AND SHE WOULD LIKE TO GET THE REFERRAL. BEST PHONE [phone] (CELL) MBC

## 2016-12-23 NOTE — Telephone Encounter (Signed)
Referral placed.

## 2017-04-25 ENCOUNTER — Encounter: Payer: BLUE CROSS/BLUE SHIELD | Admitting: Family Medicine

## 2017-04-26 ENCOUNTER — Ambulatory Visit (INDEPENDENT_AMBULATORY_CARE_PROVIDER_SITE_OTHER): Payer: BLUE CROSS/BLUE SHIELD | Admitting: Physician Assistant

## 2017-04-26 ENCOUNTER — Encounter: Payer: Self-pay | Admitting: Physician Assistant

## 2017-04-26 VITALS — BP 137/78 | HR 75 | Temp 98.0°F | Resp 16 | Ht 63.75 in | Wt 175.0 lb

## 2017-04-26 DIAGNOSIS — H6983 Other specified disorders of Eustachian tube, bilateral: Secondary | ICD-10-CM | POA: Diagnosis not present

## 2017-04-26 MED ORDER — MECLIZINE HCL 25 MG PO TABS: 25.0000 mg | ORAL_TABLET | Freq: Three times a day (TID) | ORAL | 0 refills | Status: AC | PRN

## 2017-04-26 MED ORDER — CETIRIZINE HCL 10 MG PO TABS: 10.0000 mg | ORAL_TABLET | Freq: Every day | ORAL | 5 refills | Status: AC

## 2017-04-26 MED ORDER — OLOPATADINE HCL 0.1 % OP SOLN: 1.0000 [drp] | Freq: Two times a day (BID) | OPHTHALMIC | 5 refills | Status: AC

## 2017-04-26 MED ORDER — FLUTICASONE PROPIONATE 50 MCG/ACT NA SUSP
2.0000 | Freq: Every day | NASAL | 5 refills | Status: AC
Start: 2017-04-26 — End: ?

## 2017-04-26 NOTE — Patient Instructions (Addendum)
IF you received an x-ray today, you will receive an invoice from [facility]. Please contact [facility] at [phone] with questions or concerns regarding your invoice.   IF you received labwork today, you will receive an invoice from [place]. Please contact [facility] at [phone] with questions or concerns regarding your invoice.   Our billing staff will not be able to assist you with questions regarding bills from these companies.  You will be contacted with the lab results as soon as they are available. The fastest way to get your results is to activate your My Chart account. Instructions are located on the last page of this paperwork. If you have not heard from [place] regarding the results in 2 weeks, please contact this office.      Disfuncin de la trompa de Eustaquio (Eustachian Tube Dysfunction) La trompa de Eustaquio conecta el odo medio con la parte posterior de la nariz. Regula la presin de Web designer odo medio al permitir que el aire circule por el odo y la [place]. Tambin ayuda a Forensic psychologist lquido del espacio del odo [place]. Cuando la trompa de Eustaquio no funciona bien, se puede producir una acumulacin de presin de aire, lquido o ambos en el odo medio. La disfuncin de la trompa de Eustaquio puede Audiological scientist a uno o a [employer]. CAUSAS Esta afeccin ocurre cuando la trompa de [place] se bloquea o no puede abrirse normalmente. Puede ser consecuencia de lo siguiente:  Infecciones en los odos.  Resfriados y otras infecciones de las vas respiratorias superiores.  Alergias.  Irritacin, por ejemplo, por el humo del cigarrillo o los cidos del estmago que vuelven hacia el esfago (reflujo gastroesofgico).  Cambios sbitos en la presin del aire, como cuando baja un avin.  Crecimientos anormales en la nariz o la garganta, como plipos nasales, tumores o tejido engrosado en la parte posterior de la garganta (adenoides). FACTORES DE  RIESGO Puede ser ms probable que esta afeccin se desarrolle en las personas que fuman y las personas que tienen sobrepeso. Tambin es ms probable que la disfuncin de la trompa de Data processing manager se produzca en los nios, especialmente los nios que presentan lo siguiente:  Ciertos defectos congnitos en la boca, como fisura del paladar.  [place]as y [place]. SNTOMAS Los sntomas de esta afeccin pueden incluir lo siguiente:  Sensacin de que el odo est tapado.  Dolor de odo.  Ruidos como chasquidos o crujidos en el odo.  Zumbidos en el odo.  Prdida auditiva.  Prdida del equilibrio. Los sntomas pueden empeorar cuando la presin que tiene a su alrededor [place], como cuando viaja a una zona de mayor altura o en avin. DIAGNSTICO Esta afeccin se puede diagnosticar en funcin de lo siguiente:  Sus sntomas.  Un examen fsico del odo, de la [place] y de Administrator.  Pruebas en las que se determine lo siguiente: ? El movimiento de la membrana del tmpano (timpanograma). ? La audicin Allie Bossier). TRATAMIENTO El tratamiento depende de la causa y de la gravedad de Astronomer. Si los sntomas son leves, es posible que pueda aliviarlos haciendo circular aire ("destapar") dentro de los odos. Si tiene sntomas de [employer], el tratamiento puede incluir lo siguiente:  Tax inspector.  Antihistamnicos.  Aerosoles nasales o gotas para los odos que contengan medicamentos para reducir la hinchazn (corticoides). En algunos casos, puede necesitar un procedimiento para drenar el lquido de la membrana del tmpano (miringotoma). En este procedimiento, se coloca un tubo  pequeo en la membrana del tmpano para hacer lo siguiente:  Drenar el lquido.  Restablecer el aire en el espacio del odo medio. INSTRUCCIONES PARA EL CUIDADO EN EL HOGAR  Tome los medicamentos de venta libre y los recetados solamente como se lo haya indicado el mdico.  Utilice las  tcnicas recomendadas por el mdico para ayudar a Conservator, museum/gallerydestapar los odos. Estas pueden incluir las siguientes: ? Airline pilotMasticar goma de mascar. ? Bostezos. ? Tragar vigorosamente con frecuencia. ? Cerrar la boca, taparse la nariz y soplar suavemente por la nariz como si tratara de soltar el aire.  No haga ninguna de estas cosas hasta que el mdico lo autorice: ? Viajar a grandes alturas. ? Viajar en avin. ? Trabajar en una cabina o una habitacin presurizada. ? Practicar buceo.  Mantener secos los odos. Squese bien los odos despus de ducharse o darse un bao.  No fume.  Concurra a todas las visitas de control como se lo haya indicado el mdico. Esto es importante. SOLICITE ATENCIN MDICA SI:  Los sntomas no desaparecen despus del tratamiento.  Los sntomas regresan despus del tratamiento.  No puede destaparse los odos.  Tiene los siguientes sntomas: ? Grant RutsFiebre. ? Dolor en el odo. ? Dolor de cabeza o en el cuello. ? Hay lquido que sale del odo.  La audicin cambia de repente.  Se siente muy mareado.  Pierde el equilibrio. Esta informacin no tiene Theme park managercomo fin reemplazar el consejo del mdico. Asegrese de hacerle al mdico cualquier pregunta que tenga. Document Released: 11/30/2014 Document Revised: 11/30/2014 Document Reviewed: 11/30/2014 Elsevier Interactive Patient Education  [employer]2018 Elsevier Inc.

## 2017-04-29 NOTE — Progress Notes (Signed)
[facility] [address], [place] [phone]  Date:  04/26/2017   Name:  Tara Suarez   DOB:  July 17, 1991   MRN:  [id number]  PCP:  Patient, No Pcp Per    History of Present Illness:  Tara Suarez is a 26 y.o. female patient who presents to PCP with  Chief Complaint  Patient presents with  . Tinnitus    a while      Patient notes months of hissing sound that is exacerbated by opening her mouth.  She has no noted hearing loss or drainage from ear.  No fever.  Congestion and sneezing is present.  At times her eyes are incredibly dry.  She was to take zyrtec and flonase.  She is not taking her either medicine despite this advised by previous visit as well as with ent specialist.     Patient Active Problem List   Diagnosis Date Noted  . 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 Medical history non-contributory     Past Surgical History:  Procedure Laterality Date  . CESAREAN SECTION N/A 06/10/2016   Procedure: CESAREAN SECTION;  Surgeon: Merlin Bingharlie Pickens, MD;  [facility];  Service: Obstetrics;  Laterality: N/A;  . NO PAST SURGERIES      Social History  Substance Use Topics  . Smoking status: Never Smoker  . Smokeless tobacco: Never Used  . Alcohol use No    Family History  Problem Relation Age of Onset  . Diabetes Mother   . Heart disease Mother     No Known Allergies  Medication list has been reviewed and updated.  No current outpatient prescriptions on file prior to visit.   No current facility-administered medications on file prior to visit.     ROS ROS otherwise unremarkable unless listed above.  Physical Examination: BP 137/78   Pulse 75   Temp 98 F (36.7 C) (Oral)   Resp 16   Ht 5' 3.75" (1.619 m)   Wt 175 lb (79.4 kg)   SpO2 99%   BMI 30.27 kg/m  Ideal Body Weight: Weight in (lb) to have BMI = 25: 144.2  Physical Exam  Constitutional:  She is oriented to person, place, and time. She appears well-developed and well-nourished. No distress.  HENT:  Head: Normocephalic and atraumatic.  Right Ear: External ear normal. Tympanic membrane is bulging.  Left Ear: External ear normal. Tympanic membrane is bulging.  Nose: Mucosal edema present.  Mouth/Throat: No oropharyngeal exudate, posterior oropharyngeal edema or posterior oropharyngeal erythema.  Eyes: Conjunctivae and EOM are normal. Pupils are equal, round, and reactive to light. Right eye exhibits no discharge. Left eye exhibits no discharge.  Cardiovascular: Normal rate.   Pulmonary/Chest: Effort normal. No respiratory distress.  Neurological: She is alert and oriented to person, place, and time.  Skin: She is not diaphoretic.  Psychiatric: She has a normal mood and affect. Her behavior is normal.     Assessment and Plan: Tara Suarez is a 26 y.o. female who is here today for cc of ear discomfort Advised to continue the zyrtec and flonase.  Advised ot contact if no improvement in 2 days.   1. Dysfunction of both eustachian tubes - fluticasone (FLONASE) 50 MCG/ACT nasal spray; Place 2 sprays into both nostrils at bedtime.  Dispense: 16 g; Refill: 5 - meclizine (ANTIVERT) 25 MG tablet; Take 1 tablet (25 mg total) by mouth 3 (three)  times daily as needed for dizziness.  Dispense: 21 tablet; Refill: 0 - cetirizine (ZYRTEC) 10 MG tablet; Take 1 tablet (10 mg total) by mouth at bedtime.  Dispense: 30 tablet; Refill: 5   Trena Platt, PA-C [facility] 04/29/2017 9:17 AM

## 2017-10-31 IMAGING — US US MFM OB COMP +14 WKS
1 series · 14 of 28 positions shown · non-contrast
Comparison: none

[Series 1: us mfm ob comp +14 wks · 14 of 128 slices shown]
[im 5/128]
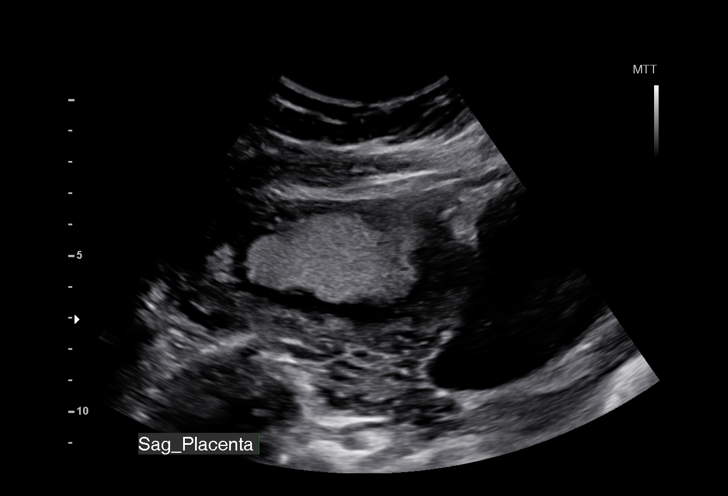
[im 15/128]
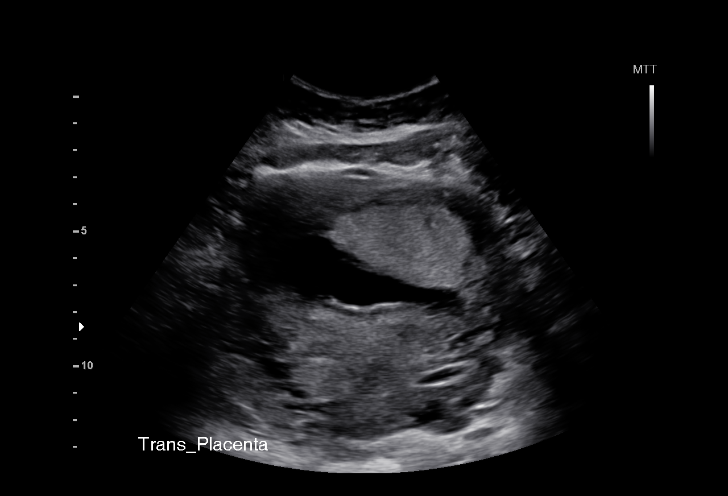
[im 24/128]
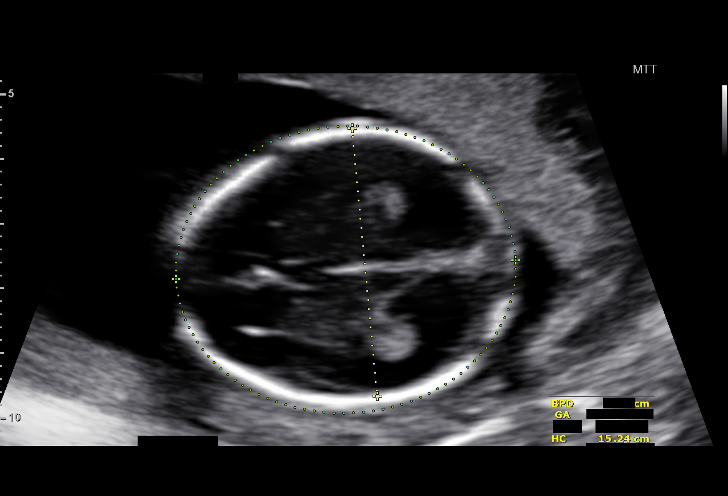
[im 33/128]
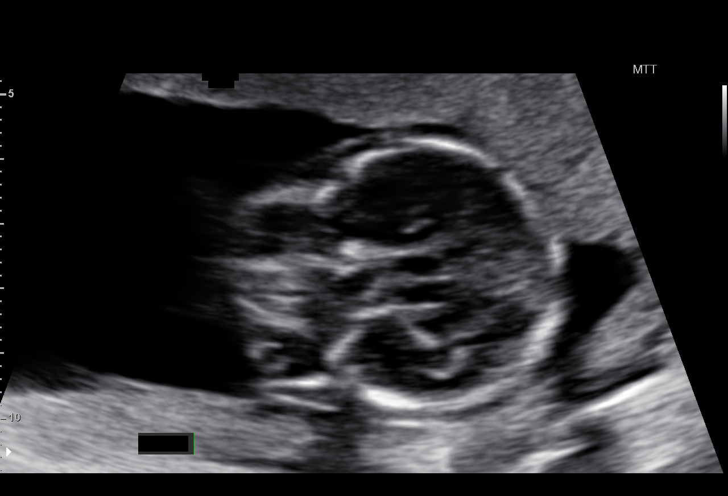
[im 43/128]
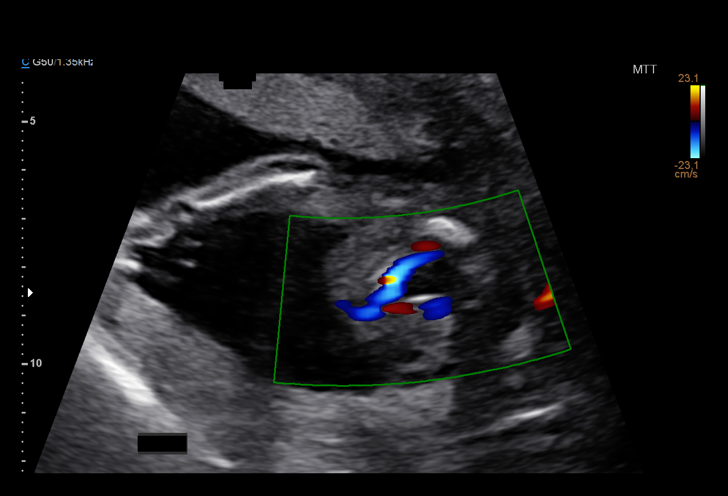
[im 52/128]
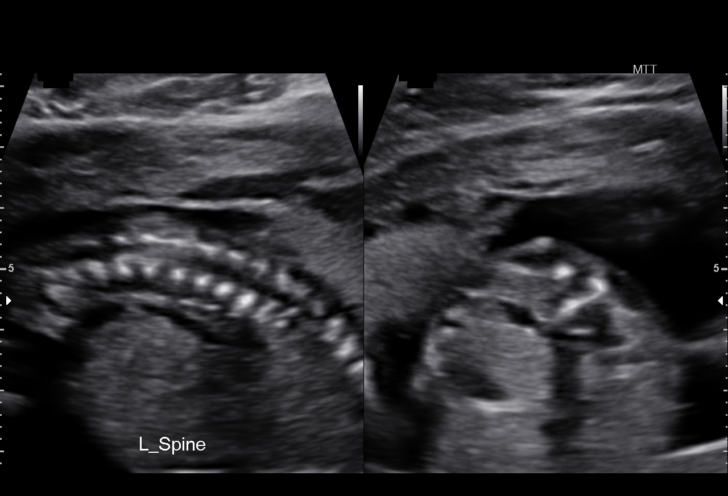
[im 62/128]
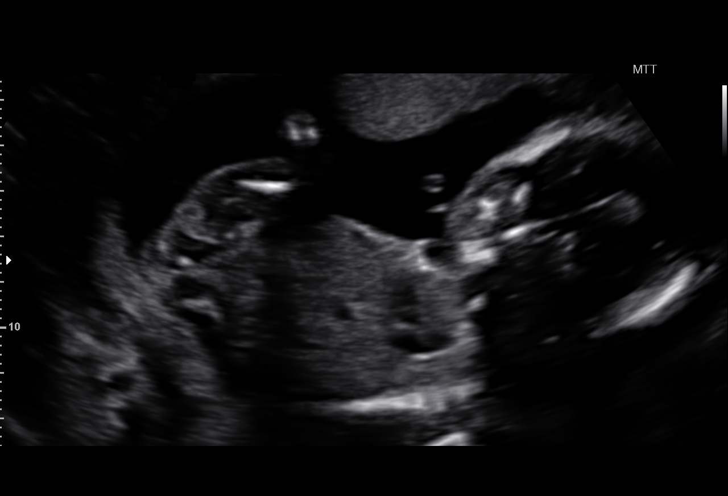
[im 71/128]
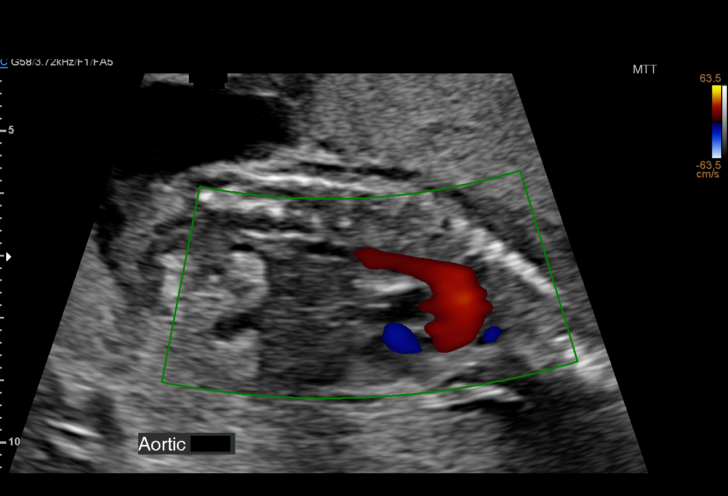
[im 80/128]
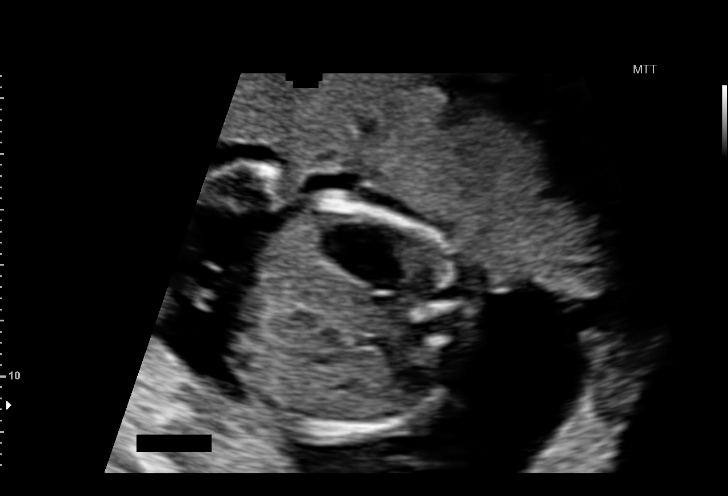
[im 90/128]
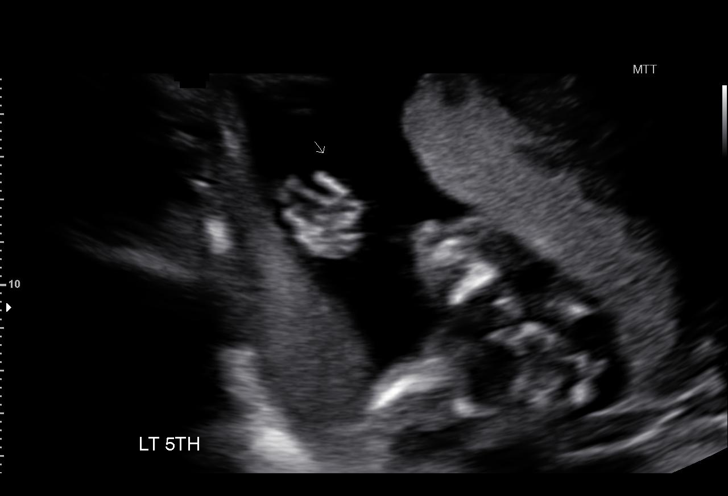
[im 99/128]
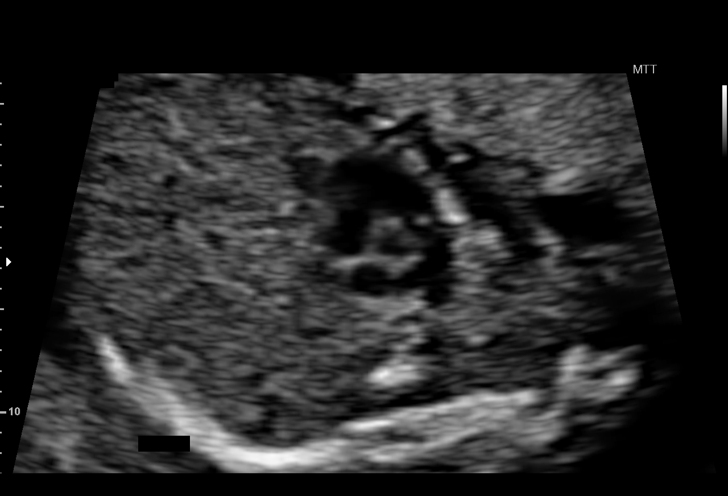
[im 109/128]
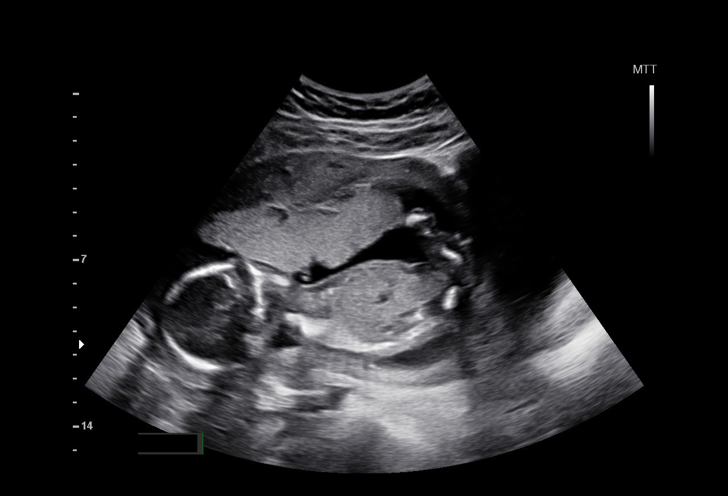
[im 118/128]
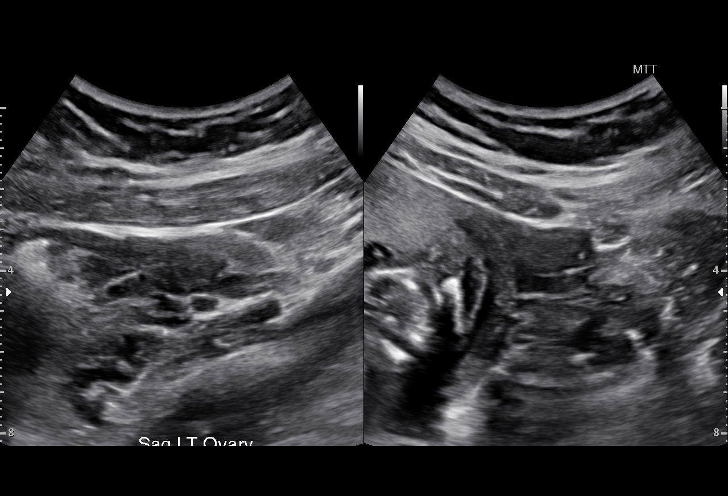
[im 128/128]
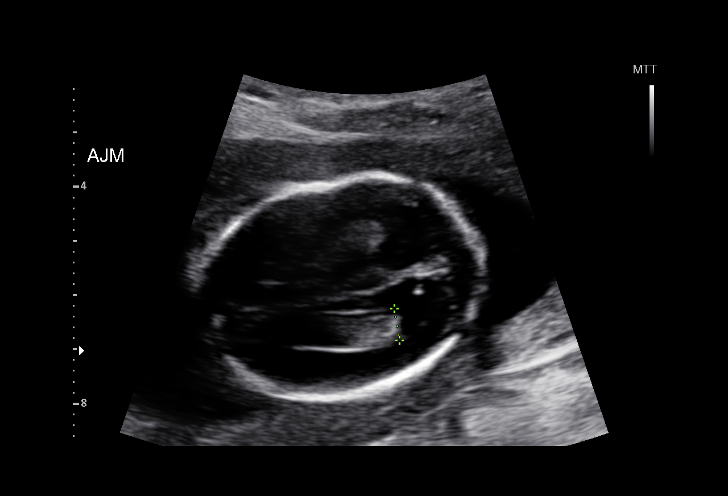

[14 of 28 positions shown; findings below may reference images not displayed]

OB/Gyn Clinic
[REDACTED]-
Faculty Physician

1  JONILAY TRIPOLIANO              [id number]      [id number]     [phone]
Indications

19 weeks gestation of pregnancy
Basic anatomic survey                          Z36
OB History

Gravidity:    2         Term:   1        Prem:   0        SAB:   0
TOP:          0       Ectopic:  0        Living: 1
Fetal Evaluation

Num Of Fetuses:     1
Fetal Heart         143
Rate(bpm):
Cardiac Activity:   Observed
Presentation:       Variable
Placenta:           Anterior, above cervical os
P. Cord Insertion:  Visualized, central

Amniotic Fluid
AFI FV:      Subjectively within normal limits
Biometry

BPD:      41.6  mm     G. Age:  18w 4d                  CI:        74.31   %   70 - 86
FL/HC:      18.0   %   16.1 -
HC:      153.2  mm     G. Age:  18w 2d         10  %    HC/AC:      1.09       1.09 -
AC:      140.6  mm     G. Age:  19w 3d         55  %    FL/BPD:     66.1   %
FL:       27.5  mm     G. Age:  18w 3d         20  %    FL/AC:      19.6   %   20 - 24
HUM:      26.3  mm     G. Age:  18w 2d         28  %
CER:      18.3  mm     G. Age:  18w 1d         21  %
NFT:       3.1  mm
LV:        5.4  mm
CM:        3.6  mm

Est. FW:     262  gm      0 lb 9 oz     40  %
Gestational Age

LMP:           20w 0d       Date:   08/30/15                 EDD:   06/05/16
U/S Today:     18w 5d                                        EDD:   06/14/16
Best:          19w 1d    Det. By:   Early Ultrasound         EDD:   06/11/16
(11/28/15)
Anatomy

Cranium:          Appears normal         Aortic Arch:      Appears normal
Fetal Cavum:      Appears normal         Ductal Arch:      Appears normal
Ventricles:       Appears normal         Diaphragm:        Appears normal
Choroid Plexus:   Appears normal         Stomach:          Appears normal, left
sided
Cerebellum:       Appears normal         Abdomen:          Appears normal
Posterior Fossa:  Appears normal         Abdominal Wall:   Appears nml (cord
insert, abd wall)
Nuchal Fold:      Appears normal         Cord Vessels:     Appears normal (3
vessel cord)
Face:             Appears normal         Kidneys:          Appear normal
(orbits and profile)
Lips:             Appears normal         Bladder:          Appears normal
Fetal Thoracic:   Appears normal         Spine:            Appears normal
Heart:            Appears normal         Upper             Appears normal
(4CH, axis, and        Extremities:
situs)
RVOT:             Appears normal         Lower             Appears normal
Extremities:
LVOT:             Appears normal

Other:  Fetus appears to be a male. Heels and 5th digit visualized. Nasal
bone visualized.
Cervix Uterus Adnexa

Cervix
Length:              4  cm.
Normal appearance by transabdominal scan.

Uterus
No abnormality visualized.

Left Ovary
Size(cm)     2.36  x    1.87   x  1.18      Vol(ml):
Within normal limits. No adnexal mass visualized.

Right Ovary
Size(cm)     2.89  x    1.84   x  1.55      Vol(ml):
Within normal limits. No adnexal mass visualized.

Cul De Sac:   No free fluid seen.

Adnexa:       No abnormality visualized.
Impression

SIUP at 19+1 weeks
Normal detailed fetal anatomy
Markers of aneuploidy: none
Normal amniotic fluid volume
Measurements consistent with early US
Recommendations

Follow-up as clinically indicated

## 2018-01-30 ENCOUNTER — Encounter: Payer: Self-pay | Admitting: *Deleted

## 2018-02-24 ENCOUNTER — Encounter: Payer: Self-pay | Admitting: Physician Assistant

## 2021-05-07 DIAGNOSIS — R42 Dizziness and giddiness: Secondary | ICD-10-CM | POA: Diagnosis not present

## 2021-05-07 DIAGNOSIS — L299 Pruritus, unspecified: Secondary | ICD-10-CM | POA: Diagnosis not present

## 2021-05-07 DIAGNOSIS — H9313 Tinnitus, bilateral: Secondary | ICD-10-CM | POA: Diagnosis not present

## 2021-07-09 DIAGNOSIS — M9901 Segmental and somatic dysfunction of cervical region: Secondary | ICD-10-CM | POA: Diagnosis not present

## 2021-07-09 DIAGNOSIS — M9902 Segmental and somatic dysfunction of thoracic region: Secondary | ICD-10-CM | POA: Diagnosis not present

## 2021-07-12 DIAGNOSIS — M9901 Segmental and somatic dysfunction of cervical region: Secondary | ICD-10-CM | POA: Diagnosis not present

## 2021-07-12 DIAGNOSIS — M9902 Segmental and somatic dysfunction of thoracic region: Secondary | ICD-10-CM | POA: Diagnosis not present

## 2021-07-26 DIAGNOSIS — M9901 Segmental and somatic dysfunction of cervical region: Secondary | ICD-10-CM | POA: Diagnosis not present

## 2021-07-26 DIAGNOSIS — M9902 Segmental and somatic dysfunction of thoracic region: Secondary | ICD-10-CM | POA: Diagnosis not present

## 2021-08-09 DIAGNOSIS — M9901 Segmental and somatic dysfunction of cervical region: Secondary | ICD-10-CM | POA: Diagnosis not present

## 2021-08-09 DIAGNOSIS — M9902 Segmental and somatic dysfunction of thoracic region: Secondary | ICD-10-CM | POA: Diagnosis not present

## 2021-08-16 DIAGNOSIS — M9901 Segmental and somatic dysfunction of cervical region: Secondary | ICD-10-CM | POA: Diagnosis not present

## 2021-08-16 DIAGNOSIS — M9902 Segmental and somatic dysfunction of thoracic region: Secondary | ICD-10-CM | POA: Diagnosis not present

## 2021-09-07 DIAGNOSIS — Z3046 Encounter for surveillance of implantable subdermal contraceptive: Secondary | ICD-10-CM | POA: Diagnosis not present

## 2021-09-07 DIAGNOSIS — Z3009 Encounter for other general counseling and advice on contraception: Secondary | ICD-10-CM | POA: Diagnosis not present

## 2021-09-26 DIAGNOSIS — H04123 Dry eye syndrome of bilateral lacrimal glands: Secondary | ICD-10-CM | POA: Diagnosis not present

## 2022-04-20 DIAGNOSIS — Z13228 Encounter for screening for other metabolic disorders: Secondary | ICD-10-CM | POA: Diagnosis not present

## 2022-04-20 DIAGNOSIS — R69 Illness, unspecified: Secondary | ICD-10-CM | POA: Diagnosis not present
